# Patient Record
Sex: Female | Born: 1965 | Race: White | Hispanic: No | Marital: Married | Smoking: Former smoker
Health system: Southern US, Community
[De-identification: ages and names within clinical notes are randomized; demographics above are authoritative.]

## PROBLEM LIST (undated history)

## (undated) DIAGNOSIS — I10 Essential (primary) hypertension: Secondary | ICD-10-CM

## (undated) HISTORY — PX: ANKLE SURGERY: SHX546

## (undated) HISTORY — PX: HAND RECONSTRUCTION: SHX1730

## (undated) HISTORY — PX: BACK SURGERY: SHX140

---

## 1998-09-19 ENCOUNTER — Ambulatory Visit (HOSPITAL_BASED_OUTPATIENT_CLINIC_OR_DEPARTMENT_OTHER): Admission: RE | Admit: 1998-09-19 | Discharge: 1998-09-19 | Payer: Self-pay | Admitting: Orthopaedic Surgery

## 1999-04-23 ENCOUNTER — Other Ambulatory Visit: Admission: RE | Admit: 1999-04-23 | Discharge: 1999-04-23 | Payer: Self-pay | Admitting: Obstetrics and Gynecology

## 2000-02-03 ENCOUNTER — Ambulatory Visit (HOSPITAL_COMMUNITY): Admission: RE | Admit: 2000-02-03 | Discharge: 2000-02-03 | Payer: Self-pay | Admitting: Obstetrics and Gynecology

## 2000-05-04 ENCOUNTER — Inpatient Hospital Stay (HOSPITAL_COMMUNITY): Admission: AD | Admit: 2000-05-04 | Discharge: 2000-05-06 | Payer: Self-pay | Admitting: Obstetrics and Gynecology

## 2000-05-07 ENCOUNTER — Encounter: Admission: RE | Admit: 2000-05-07 | Discharge: 2000-05-18 | Payer: Self-pay | Admitting: Obstetrics and Gynecology

## 2000-06-08 ENCOUNTER — Other Ambulatory Visit: Admission: RE | Admit: 2000-06-08 | Discharge: 2000-06-08 | Payer: Self-pay | Admitting: Obstetrics and Gynecology

## 2001-07-12 ENCOUNTER — Other Ambulatory Visit: Admission: RE | Admit: 2001-07-12 | Discharge: 2001-07-12 | Payer: Self-pay | Admitting: Obstetrics and Gynecology

## 2002-08-17 ENCOUNTER — Other Ambulatory Visit: Admission: RE | Admit: 2002-08-17 | Discharge: 2002-08-17 | Payer: Self-pay | Admitting: Obstetrics and Gynecology

## 2003-11-14 ENCOUNTER — Other Ambulatory Visit: Admission: RE | Admit: 2003-11-14 | Discharge: 2003-11-14 | Payer: Self-pay | Admitting: Obstetrics and Gynecology

## 2004-11-14 ENCOUNTER — Other Ambulatory Visit: Admission: RE | Admit: 2004-11-14 | Discharge: 2004-11-14 | Payer: Self-pay | Admitting: Obstetrics and Gynecology

## 2005-12-07 ENCOUNTER — Ambulatory Visit (HOSPITAL_COMMUNITY): Admission: RE | Admit: 2005-12-07 | Discharge: 2005-12-08 | Payer: Self-pay | Admitting: Orthopaedic Surgery

## 2005-12-11 ENCOUNTER — Inpatient Hospital Stay (HOSPITAL_COMMUNITY): Admission: EM | Admit: 2005-12-11 | Discharge: 2005-12-13 | Payer: Self-pay | Admitting: Emergency Medicine

## 2005-12-11 ENCOUNTER — Emergency Department (HOSPITAL_COMMUNITY): Admission: EM | Admit: 2005-12-11 | Discharge: 2005-12-11 | Payer: Self-pay | Admitting: Emergency Medicine

## 2008-11-04 ENCOUNTER — Emergency Department (HOSPITAL_COMMUNITY): Admission: EM | Admit: 2008-11-04 | Discharge: 2008-11-04 | Payer: Self-pay | Admitting: Emergency Medicine

## 2009-02-05 ENCOUNTER — Encounter: Admission: RE | Admit: 2009-02-05 | Discharge: 2009-02-05 | Payer: Self-pay | Admitting: Family Medicine

## 2011-01-30 NOTE — Consult Note (Signed)
[name], [name] NO.:  [id number]   MEDICAL RECORD NO.:  [id number]          PATIENT TYPE:  INP   LOCATION:  3009                         FACILITY:  [facility]   PHYSICIAN:  Wilmon Arms. Corliss Skains, M.D. DATE OF BIRTH:  07/23/1966   DATE OF CONSULTATION:  12/12/2005  DATE OF DISCHARGE:  12/13/2005                                   CONSULTATION   REASON FOR CONSULTATION:  Nausea, vomiting, abdominal pain.   HISTORY OF PRESENT ILLNESS:  The patient is a 45 year old female in  excellent health who is four days postoperative from a lumbar  microdiskectomy for chronic back pain.  The patient was discharged home on  Tuesday and has been using Tylox and Robaxin regularly.  The patient did not  have a bowel movement for several days but had one bowel movement on  Thursday evening.  Friday morning, she awoke with severe generalized  abdominal pain.  She presented to the emergency department where she was  worked up and was found to be constipated.  She was given an enema with good  results.  The patient has since had several hard bowel movements.  She was  discharged later in the day on Friday.  She did have two episodes of  nonbloody emesis.  She now is having some diarrhea containing small blood  clots.  She also complains of lower abdominal pain.  She was seen in the  emergency department once again this evening and is being admitted by Dr.  Ophelia Charter for further evaluation.   We are asked to consult.   ALLERGIES:  None.   MEDICATIONS:  Tylox and Robaxin p.r.n.   PAST MEDICAL HISTORY:  Chronic back pain.   PAST SURGICAL HISTORY:  Lumbar discectomy and ankle surgery.   SOCIAL HISTORY:  Nonsmoker and nondrinker.   PHYSICAL EXAMINATION:  VITAL SIGNS:  Temperature 96.8, blood pressure  119/77, pulse 103, respirations 24.  GENERAL:  This is a well-developed, well-nourished female who appears  uncomfortable with a nasogastric tube in place.  HEENT:  EOMI.  Sclerae  anicteric.  LUNGS:  Clear to auscultation bilaterally.  Normal respiratory effort.  HEART:  Regular rate and rhythm.  No murmurs.  ABDOMEN:  Normal bowel sounds.  Soft, mildly tender in both lower quadrants  with no rebound and no guarding.  SKIN:  Warm and dry with no sign of jaundice.   LABORATORY DATA:  White count 8.8, hemoglobin 12.9.  Electrolytes within  normal limits.  Total bilirubin of 0.6.  Amylase and lipase are normal.  Acute abdominal series shows normal bowel gas pattern with no evidence of  free air.  The constipation has improved from this morning x-rays.   IMPRESSION:  Ileus postoperative with narcotic use, also with some  significant constipation.  Her ileus seems to be resolving.   RECOMMENDATIONS:  Continue nasogastric tube and bowel rest with IV  hydration.  It does not appear that the patient needs any further studies at  this time.  We will continue to follow.      Wilmon Arms. Tsuei, M.D.  Electronically Signed  MKT/MEDQ  D:  12/12/2005  T:  12/15/2005  Job:  [id number]

## 2011-01-30 NOTE — Op Note (Signed)
NAMEJOLYSSA, [name]               ACCOUNT NO.:  [id number]   MEDICAL RECORD NO.:  [id number]          PATIENT TYPE:  OIB   LOCATION:  3014                         FACILITY:  [facility]   PHYSICIAN:  Mark C. Ophelia Charter, M.D.    DATE OF BIRTH:  [birth year]   DATE OF PROCEDURE:  12/07/2005  DATE OF DISCHARGE:  12/08/2005                                 OPERATIVE REPORT   PREOPERATIVE DIAGNOSIS:  Left L5-S1 herniated nucleus pulposus.   POSTOPERATIVE DIAGNOSIS:  Left L5-S1 herniated nucleus pulposus.   PROCEDURE:  Left L5-S1, microdiskectomy.   SURGEON:  Mark C. Ophelia Charter, M.D.   ANESTHESIA:  General orotracheal anesthesia, plus Marcaine to the skin  local.   ESTIMATED BLOOD LOSS:  Minimal.   PROCEDURE:  After induction of general anesthesia, orotracheal intubation  was placed on chest rolls.  The back was prepped with DuraPrep.  The usual  ___________ and thigh drape application and laminectomy sheet.  A midline  incision was made at the L5-S1 interspace after needle localization with the  spinal needle cross table lateral x-ray.  Subperiosteal dissection on the  limb was performed.  There was a hypertrophic S1 spinous process and the  self retaining retractor would extend across the midline.  Ligamentum was  removed and there was said over growth with perhaps the disk space seen on  plain radiographs as well as MRI.  This had narrowed the foramina with facet  degenerative changes.  On the left side, the facet was trimmed back towards  the pedicle.  The nerve root was well visualized and gently retracted toward  the midline.  There was some mild jumping of nerve from nerve root  irritation and immediately underneath the nerve root was a large disk  herniation, which with a small stab incision two large pieces were removed.  Further passes were made and removed in some smaller pieces.  Passes were  made in the disk up and down the regular pituitary, Epstein curette.  A  small herniated sac was  completely cleaned out.  A hockey stick could be  passed anterior to the dura 180 degrees.  No areas of compression.  Nerve  root was gently palpated all sides of the foramina, no areas of compression.  The lateral gutter was smooth with no except bones by irrigation of the disk  and saline solution.  IV fascia was closed with 0 Vicryl, 2-0 Vicryl,  subcutaneous tissue, 4-0 Vicryl subcuticular skin closure, tincture of  Benzoin, Steri-Strips, 4 x 4s and tape.  Instrument count and needle counts  were correct.   ADDENDUM:  Preoperative Ancef was given prior to skin incision and started  on induction.      Mark C. Ophelia Charter, M.D.  Electronically Signed     MCY/MEDQ  D:  12/07/2005  T:  12/09/2005  Job:  [id number]

## 2012-11-24 ENCOUNTER — Emergency Department (HOSPITAL_COMMUNITY): Payer: BC Managed Care – PPO

## 2012-11-24 ENCOUNTER — Encounter (HOSPITAL_COMMUNITY): Payer: Self-pay | Admitting: *Deleted

## 2012-11-24 ENCOUNTER — Emergency Department (HOSPITAL_COMMUNITY)
Admission: EM | Admit: 2012-11-24 | Discharge: 2012-11-25 | Disposition: A | Discharge status: Home / Self Care | Payer: BC Managed Care – PPO | Attending: Emergency Medicine | Admitting: Emergency Medicine

## 2012-11-24 DIAGNOSIS — F17200 Nicotine dependence, unspecified, uncomplicated: Secondary | ICD-10-CM | POA: Insufficient documentation

## 2012-11-24 DIAGNOSIS — Y9389 Activity, other specified: Secondary | ICD-10-CM | POA: Insufficient documentation

## 2012-11-24 DIAGNOSIS — Z23 Encounter for immunization: Secondary | ICD-10-CM | POA: Insufficient documentation

## 2012-11-24 DIAGNOSIS — Y92009 Unspecified place in unspecified non-institutional (private) residence as the place of occurrence of the external cause: Secondary | ICD-10-CM | POA: Insufficient documentation

## 2012-11-24 DIAGNOSIS — W268XXA Contact with other sharp object(s), not elsewhere classified, initial encounter: Secondary | ICD-10-CM | POA: Insufficient documentation

## 2012-11-24 DIAGNOSIS — S61411A Laceration without foreign body of right hand, initial encounter: Secondary | ICD-10-CM

## 2012-11-24 DIAGNOSIS — S61409A Unspecified open wound of unspecified hand, initial encounter: Secondary | ICD-10-CM | POA: Insufficient documentation

## 2012-11-24 DIAGNOSIS — R209 Unspecified disturbances of skin sensation: Secondary | ICD-10-CM | POA: Insufficient documentation

## 2012-11-24 MED ORDER — MORPHINE SULFATE 4 MG/ML IJ SOLN
4.0000 mg | Freq: Once | INTRAMUSCULAR | Status: AC
  Administered 2012-11-24: 4 mg via INTRAMUSCULAR
  Filled 2012-11-24: qty 1

## 2012-11-24 MED ORDER — TETANUS-DIPHTH-ACELL PERTUSSIS 5-2.5-18.5 LF-MCG/0.5 IM SUSP
0.5000 mL | Freq: Once | INTRAMUSCULAR | Status: AC
  Administered 2012-11-24: 0.5 mL via INTRAMUSCULAR
  Filled 2012-11-24: qty 0.5

## 2012-11-24 NOTE — ED Provider Notes (Addendum)
History     CSN: [id number]  Arrival date & time 11/24/12  2114   First MD Initiated Contact with Patient 11/24/12 2209      Chief Complaint  Patient presents with  . Hand Injury    (Consider location/radiation/quality/duration/timing/severity/associated sxs/prior treatment) HPI  This is a pleasant 46 rolled female who fell in her driveway with a wine bottle in her hand tonight and suffered a laceration to the right palm. She states she pulled a piece of glass out of it. It is not particularly painful this point in time. She does have some numbness down to her fingers and has not been able to bend her finger. He states it does hurt up into her forearm when she attempts to bend the fingers in her right hand. She is left-handed  this happened just prior to presentation. She has had some spurting of blood from this area. It is controlled with pressure dressing. She is unsure when her last tetanus was.  History reviewed. No pertinent past medical history.  Past Surgical History  Procedure Laterality Date  . Back surgery      No family history on file.  History  Substance Use Topics  . Smoking status: Current Some Day Smoker    Types: Cigarettes  . Smokeless tobacco: Not on file  . Alcohol Use: 1.2 oz/week    2 Glasses of wine per week    OB History   Grav Para Term Preterm Abortions TAB SAB Ect Mult Living                  Review of Systems  Skin: Positive for wound.    Allergies  Review of patient's allergies indicates no known allergies.  Home Medications   Current Outpatient Rx  Name  Route  Sig  Dispense  Refill  . Milk Thistle 1000 MG CAPS   Oral   Take 1 capsule by mouth daily.           BP 122/81  Pulse 76  Temp(Src) 98.3 F (36.8 C) (Oral)  Resp 18  SpO2 100%  LMP 11/24/2012  Physical Exam  Nursing note and vitals reviewed. Constitutional: She is oriented to person, place, and time. She appears well-developed and well-nourished.  HENT:   Head: Normocephalic and atraumatic.  Eyes: Conjunctivae are normal. Pupils are equal, round, and reactive to light.  Neck: Normal range of motion.  Musculoskeletal:  3 cm laceration base of palm ulnar aspect Fourth and fifth finger are able to flex. 2 point sensation is intact to dip of fifth finger but is decreased distally.    Neurological: She is alert and oriented to person, place, and time.  Skin: Skin is warm and dry.  Psychiatric: She has a normal mood and affect.    ED Course  Procedures (including critical care time)  Labs Reviewed - No data to display Dg Hand Complete Right  11/24/2012  *RADIOLOGY REPORT*  Clinical Data: Pain, laceration to the wrist  RIGHT HAND - COMPLETE 3+ VIEW  Comparison: None.  Findings: The third digit is in the flexed position which limits evaluation.  No fracture or dislocation is identified.  No radiopaque foreign body.  Bandage overlies the hand.  IMPRESSION: No acute osseous abnormality.  See above.  No radiopaque foreign body.   Original Report Authenticated By: Jearld Lesch, M.D.      No diagnosis found.    MDM  Please see procedure note by Perrin Maltese. I was present and supervised  laceration repair. No foreign body was seen on x-ray or exploration. Skin is being loosely closed. Plan is to splint the wrist. Dr. Izora Ribas is being consulted for close followup. Patient received her tetanus shot here in emergency department the   Discussed with Dr. Izora Ribas and patient is to call at 9 AM tomorrow morning to be seen tomorrow. She is placed on Keflex the    Hilario Quarry, MD 11/24/12 1191  Hilario Quarry, MD 11/25/12 [phone]

## 2012-11-24 NOTE — ED Notes (Signed)
Pt tripped and fell; carrying a wine bottle; fell breaking wine bottle--landed on broken bottle with right hand; presents with large laceration palm of hand-moderate amt of bleeding; c/o numbness to right small finger and partial numbness right ring finger; unable to bend small finger and ring finger

## 2012-11-25 MED ORDER — OXYCODONE-ACETAMINOPHEN 5-325 MG PO TABS: 2.0000 | ORAL_TABLET | ORAL | Status: DC | PRN

## 2012-11-25 MED ORDER — CEPHALEXIN 500 MG PO CAPS: 500.0000 mg | ORAL_CAPSULE | Freq: Four times a day (QID) | ORAL | Status: DC

## 2012-11-25 MED ORDER — OXYCODONE-ACETAMINOPHEN 5-325 MG PO TABS
2.0000 | ORAL_TABLET | Freq: Once | ORAL | Status: AC
  Administered 2012-11-25: 2 via ORAL
  Filled 2012-11-25: qty 2

## 2012-11-25 NOTE — ED Provider Notes (Signed)
[name],[name] Patient discussed with attending physician. Will assist with wound repair of right hand.  LACERATION REPAIR Performed by: Angus Seller Authorized by: Angus Seller Consent: Verbal consent obtained. Risks and benefits: risks, benefits and alternatives were discussed Consent given by: patient Patient identity confirmed: provided demographic data Prepped and Draped in normal sterile fashion Wound explored  Laceration Location: Minor eminence of right palm  Laceration Length: 4 cm  No Foreign Bodies seen or palpated  Anesthesia: local infiltration  Local anesthetic: lidocaine 2% with epinephrine  Anesthetic total: 8 ml  Irrigation method: syringe Amount of cleaning: standard  Skin closure: Skin with 4-0 Prolene   Number of sutures: 5   Technique: Simple   Patient tolerance: Patient tolerated the procedure well with no immediate complications.     Angus Seller, PA-C 11/25/12 [phone]

## 2012-12-03 NOTE — ED Provider Notes (Signed)
History/physical exam/procedure(s) were performed by non-physician practitioner and as supervising physician I was immediately available for consultation/collaboration. I have reviewed all notes and am in agreement with care and plan.   Danielle S Ray, MD 12/03/12 0754 

## 2013-10-17 ENCOUNTER — Other Ambulatory Visit (HOSPITAL_COMMUNITY): Payer: Self-pay | Admitting: Interventional Radiology

## 2013-10-17 DIAGNOSIS — I8393 Asymptomatic varicose veins of bilateral lower extremities: Secondary | ICD-10-CM

## 2013-11-01 ENCOUNTER — Inpatient Hospital Stay: Admission: RE | Admit: 2013-11-01 | Payer: BC Managed Care – PPO | Source: Ambulatory Visit

## 2013-11-01 ENCOUNTER — Other Ambulatory Visit: Payer: BC Managed Care – PPO

## 2013-11-14 ENCOUNTER — Other Ambulatory Visit (HOSPITAL_COMMUNITY): Payer: Self-pay | Admitting: Interventional Radiology

## 2013-11-14 ENCOUNTER — Ambulatory Visit
Admission: RE | Admit: 2013-11-14 | Discharge: 2013-11-14 | Disposition: A | Discharge status: Home / Self Care | Payer: BC Managed Care – PPO | Source: Ambulatory Visit | Attending: Interventional Radiology | Admitting: Interventional Radiology

## 2013-11-14 DIAGNOSIS — I8393 Asymptomatic varicose veins of bilateral lower extremities: Secondary | ICD-10-CM

## 2013-11-15 ENCOUNTER — Other Ambulatory Visit (HOSPITAL_COMMUNITY): Payer: Self-pay | Admitting: Interventional Radiology

## 2013-11-15 DIAGNOSIS — I8390 Asymptomatic varicose veins of unspecified lower extremity: Secondary | ICD-10-CM

## 2013-12-13 ENCOUNTER — Ambulatory Visit: Payer: BC Managed Care – PPO

## 2014-07-04 ENCOUNTER — Ambulatory Visit (INDEPENDENT_AMBULATORY_CARE_PROVIDER_SITE_OTHER): Payer: BC Managed Care – PPO | Admitting: Emergency Medicine

## 2014-07-04 ENCOUNTER — Ambulatory Visit (INDEPENDENT_AMBULATORY_CARE_PROVIDER_SITE_OTHER): Payer: BC Managed Care – PPO

## 2014-07-04 VITALS — BP 124/88 | HR 85 | Temp 97.5°F | Resp 24 | Ht 66.0 in | Wt 127.4 lb

## 2014-07-04 DIAGNOSIS — R0602 Shortness of breath: Secondary | ICD-10-CM

## 2014-07-04 DIAGNOSIS — J018 Other acute sinusitis: Secondary | ICD-10-CM

## 2014-07-04 DIAGNOSIS — J219 Acute bronchiolitis, unspecified: Secondary | ICD-10-CM

## 2014-07-04 MED ORDER — PSEUDOEPHEDRINE-GUAIFENESIN ER 60-600 MG PO TB12: 1.0000 | ORAL_TABLET | Freq: Two times a day (BID) | ORAL | Status: AC

## 2014-07-04 MED ORDER — ALBUTEROL SULFATE (2.5 MG/3ML) 0.083% IN NEBU
5.0000 mg | INHALATION_SOLUTION | Freq: Once | RESPIRATORY_TRACT | Status: AC
  Administered 2014-07-04: 5 mg via RESPIRATORY_TRACT

## 2014-07-04 MED ORDER — IPRATROPIUM BROMIDE 0.02 % IN SOLN
0.2500 mg | Freq: Once | RESPIRATORY_TRACT | Status: AC
  Administered 2014-07-04: 0.25 mg via RESPIRATORY_TRACT

## 2014-07-04 MED ORDER — AMOXICILLIN-POT CLAVULANATE 875-125 MG PO TABS: 1.0000 | ORAL_TABLET | Freq: Two times a day (BID) | ORAL | Status: DC

## 2014-07-04 MED ORDER — ALBUTEROL SULFATE HFA 108 (90 BASE) MCG/ACT IN AERS: 2.0000 | INHALATION_SPRAY | RESPIRATORY_TRACT | Status: DC | PRN

## 2014-07-04 NOTE — Patient Instructions (Signed)
Metered Dose Inhaler (No Spacer Used)  Inhaled medicines are the basis of treatment for asthma and other breathing problems. Inhaled medicine can only be effective if used properly. Good technique assures that the medicine reaches the lungs.  Metered dose inhalers (MDIs) are used to deliver a variety of inhaled medicines. These include quick relief or rescue medicines (such as bronchodilators) and controller medicines (such as corticosteroids). The medicine is delivered by pushing down on a metal canister to release a set amount of spray.  If you are using different kinds of inhalers, use your quick relief medicine to open the airways 10-15 minutes before using a steroid, if instructed to do so by your health care provider. If you are unsure which inhalers to use and the order of using them, ask your health care provider, nurse, or respiratory therapist.  HOW TO USE THE INHALER  1. Remove the cap from the inhaler.  2. If you are using the inhaler for the first time, you will need to prime it. Shake the inhaler for 5 seconds and release four puffs into the air, away from your face. Ask your health care provider or pharmacist if you have questions about priming your inhaler.  3. Shake the inhaler for 5 seconds before each breath in (inhalation).  4. Position the inhaler so that the top of the canister faces up.  5. Put your index finger on the top of the medicine canister. Your thumb supports the bottom of the inhaler.  6. Open your mouth.  7. Either place the inhaler between your teeth and place your lips tightly around the mouthpiece, or hold the inhaler 1-2 inches away from your open mouth. If you are unsure of which technique to use, ask your health care provider.  8. Breathe out (exhale) normally and as completely as possible.  9. Press the canister down with the index finger to release the medicine.  10. At the same time as the canister is pressed, inhale deeply and slowly until your lungs are completely filled.  This should take 4-6 seconds. Keep your tongue down.  11. Hold the medicine in your lungs for 5-10 seconds (10 seconds is best). This helps the medicine get into the small airways of your lungs.  12. Breathe out slowly, through pursed lips. Whistling is an example of pursed lips.  13. Wait at least 1 minute between puffs. Continue with the above steps until you have taken the number of puffs your health care provider has ordered. Do not use the inhaler more than your health care provider directs you to.  14. Replace the cap on the inhaler.  15. Follow the directions from your health care provider or the inhaler insert for cleaning the inhaler.  If you are using a steroid inhaler, after your last puff, rinse your mouth with water, gargle, and spit out the water. Do not swallow the water.  AVOID:  · Inhaling before or after starting the spray of medicine. It takes practice to coordinate your breathing with triggering the spray.  · Inhaling through the nose (rather than the mouth) when triggering the spray.  HOW TO DETERMINE IF YOUR INHALER IS FULL OR NEARLY EMPTY  You cannot know when an inhaler is empty by shaking it. Some inhalers are now being made with dose counters. Ask your health care provider for a prescription that has a dose counter if you feel you need that extra help. If your inhaler does not have a counter, ask your health care   provider to help you determine the date you need to refill your inhaler. Write the refill date on a calendar or your inhaler canister. Refill your inhaler 7-10 days before it runs out. Be sure to keep an adequate supply of medicine. This includes making sure it has not expired, and making sure you have a spare inhaler.  SEEK MEDICAL CARE IF:  · Symptoms are only partially relieved with your inhaler.  · You are having trouble using your inhaler.  · You experience an increase in phlegm.  SEEK IMMEDIATE MEDICAL CARE IF:  · You feel little or no relief with your inhalers. You are still  wheezing and feeling shortness of breath, tightness in your chest, or both.  · You have dizziness, headaches, or a fast heart rate.  · You have chills, fever, or night sweats.  · There is a noticeable increase in phlegm production, or there is blood in the phlegm.  MAKE SURE YOU:  · Understand these instructions.  · Will watch your condition.  · Will get help right away if you are not doing well or get worse.  Document Released: 06/28/2007 Document Revised: 01/15/2014 Document Reviewed: 02/16/2013  Exit[employer]® Patient Information ©2015 [employer]. This information is not intended to replace advice given to you by your health care provider. Make sure you discuss any questions you have with your health care provider.

## 2014-07-04 NOTE — Progress Notes (Addendum)
[facility] [address], [place] [address] [phone]  Date:  07/04/2014   Name:  [name]   DOB:  February 24, 1966   MRN:  [id number]  PCP:  No PCP Per Patient    Chief Complaint: Cough, Shortness of Breath, Chest Pain and Loss of appetite   History of Present Illness:  [name] is a 48 y.o. very pleasant female patient who presents with the following:  Il since last weekend but worsened.  Has nasal congestion and post nasal drainage.  Purulent in nature. Has a cough that is not productive and is associated with shortness of breath started yesterday. No wheezing.   Was able to walk 6 miles yesterday with no difficulty. Occasional smoker. No fever or chills.  No nausea or vomiting No stool change or rash. No improvement with over the counter medications or other home remedies.  Denies other complaint or health concern today.   There are no active problems to display for this patient.   History reviewed. No pertinent past medical history.  Past Surgical History  Procedure Laterality Date  . Back surgery      History  Substance Use Topics  . Smoking status: Former Smoker    Types: Cigarettes  . Smokeless tobacco: Never Used  . Alcohol Use: 4.2 oz/week    7 Glasses of wine per week    Family History  Problem Relation Age of Onset  . Heart disease Mother   . Cancer Father     No Known Allergies  Medication list has been reviewed and updated.  Current Outpatient Prescriptions on File Prior to Visit  Medication Sig Dispense Refill  . Milk Thistle 1000 MG CAPS Take 1 capsule by mouth daily.       No current facility-administered medications on file prior to visit.    Review of Systems:  As per HPI, otherwise negative.    Physical Examination: Filed Vitals:   07/04/14 1030  BP: 124/88  Pulse: 85  Temp: 97.5 F (36.4 C)  Resp: 24   Filed Vitals:   07/04/14 1030  Height: 5\' 6"  (1.676 m)  Weight: 127 lb 6.4 oz  (57.788 kg)   Body mass index is 20.57 kg/(m^2). Ideal Body Weight: Weight in (lb) to have BMI = 25: 154.6  GEN: WDWN, NAD, Non-toxic, A & O x 3 HEENT: Atraumatic, Normocephalic. Neck supple. No masses, No LAD. Ears and Nose: No external deformity. CV: RRR, No M/G/R. No JVD. No thrill. No extra heart sounds. PULM: diffuse wheezes, no crackles, rhonchi. No retractions. No resp. distress. No accessory muscle use. ABD: S, NT, ND, +BS. No rebound. No HSM. EXTR: No c/c/e NEURO Normal gait.  PSYCH: Normally interactive. Conversant. Not depressed or anxious appearing.  Calm demeanor.    Assessment and Plan: Sinusitis Bronchiolitis augmentin mucinex d Albuterol  Signed,  Phillips OdorJeffery Jace Fermin, MD   [facility] reading (PRIMARY) by  Dr. Dareen PianoAnderson.  Negative .

## 2014-08-21 ENCOUNTER — Telehealth: Payer: Self-pay | Admitting: Emergency Medicine

## 2014-08-21 NOTE — Telephone Encounter (Signed)
LMOAM AT HOME #  TO SEE IF PT READY TO SET UP APPT FOR JAN. W/ DR HOSS. ALSO MENTIONED THAT SHE WILL NOT BE ABLE TO PAY OUT OF POCKET IF SHE STILL HAS INSURANCE.

## 2014-09-05 ENCOUNTER — Telehealth: Payer: Self-pay | Admitting: Emergency Medicine

## 2014-09-05 ENCOUNTER — Other Ambulatory Visit (HOSPITAL_COMMUNITY): Payer: Self-pay | Admitting: Interventional Radiology

## 2014-09-05 DIAGNOSIS — I83811 Varicose veins of right lower extremities with pain: Secondary | ICD-10-CM

## 2014-09-05 NOTE — Telephone Encounter (Signed)
Left msg for pt to make her aware that [employer] denied her sclero and the appt on 10-23-14 will be to f/u and document conservative measures and we will refile after that visit.

## 2014-10-02 ENCOUNTER — Ambulatory Visit: Payer: BC Managed Care – PPO

## 2014-10-23 ENCOUNTER — Other Ambulatory Visit: Payer: BC Managed Care – PPO

## 2014-10-23 ENCOUNTER — Ambulatory Visit: Payer: BC Managed Care – PPO

## 2014-10-26 ENCOUNTER — Ambulatory Visit: Payer: BC Managed Care – PPO

## 2014-11-21 ENCOUNTER — Inpatient Hospital Stay: Admission: RE | Admit: 2014-11-21 | Payer: Self-pay | Source: Ambulatory Visit

## 2015-01-04 ENCOUNTER — Other Ambulatory Visit (HOSPITAL_COMMUNITY): Payer: Self-pay | Admitting: Interventional Radiology

## 2015-01-04 DIAGNOSIS — I83811 Varicose veins of right lower extremities with pain: Secondary | ICD-10-CM

## 2015-01-08 ENCOUNTER — Ambulatory Visit
Admission: RE | Admit: 2015-01-08 | Discharge: 2015-01-08 | Disposition: A | Discharge status: Home / Self Care | Payer: BLUE CROSS/BLUE SHIELD | Source: Ambulatory Visit | Attending: Interventional Radiology | Admitting: Interventional Radiology

## 2015-01-08 ENCOUNTER — Other Ambulatory Visit (HOSPITAL_COMMUNITY): Payer: Self-pay | Admitting: Interventional Radiology

## 2015-01-08 DIAGNOSIS — I83811 Varicose veins of right lower extremities with pain: Secondary | ICD-10-CM

## 2015-01-08 DIAGNOSIS — I83819 Varicose veins of unspecified lower extremities with pain: Secondary | ICD-10-CM | POA: Insufficient documentation

## 2015-01-08 NOTE — Consult Note (Signed)
Chief Complaint: Chief Complaint  Patient presents with  . Varicose Veins    Sclerotherapy of RLE for Varicose Veins      Referring Physician(s): Berley Gambrell  History of Present Illness: [name] is a 49 y.o. female returns for evaluation of the varicosities in her right calf. The pain has improved, but she continues to complain about the appearance of the varicose veins. She is here for treatment and disappearance of the varicose veins.  No past medical history on file.  Past Surgical History  Procedure Laterality Date  . Back surgery      Allergies: Review of patient's allergies indicates no known allergies.  Medications: Prior to Admission medications   Medication Sig Start Date End Date Taking? Authorizing Provider  albuterol (PROVENTIL HFA;VENTOLIN HFA) 108 (90 BASE) MCG/ACT inhaler Inhale 2 puffs into the lungs every 4 (four) hours as needed for wheezing or shortness of breath (cough, shortness of breath or wheezing.). 07/04/14   Carmelina Dane, MD  amoxicillin-clavulanate (AUGMENTIN) 875-125 MG per tablet Take 1 tablet by mouth 2 (two) times daily. 07/04/14   Carmelina Dane, MD  Milk Thistle 1000 MG CAPS Take 1 capsule by mouth daily.    Historical Provider, MD  pseudoephedrine-guaifenesin (MUCINEX D) 60-600 MG per tablet Take 1 tablet by mouth every 12 (twelve) hours. 07/04/14 07/04/15  Carmelina Dane, MD     Family History  Problem Relation Age of Onset  . Heart disease Mother   . Cancer Father     History   Social History  . Marital Status: Married    Spouse Name: N/A  . Number of Children: N/A  . Years of Education: N/A   Social History Main Topics  . Smoking status: Former Smoker    Types: Cigarettes  . Smokeless tobacco: Never Used  . Alcohol Use: 4.2 oz/week    7 Glasses of wine per week  . Drug Use: No  . Sexual Activity: Not on file   Other Topics Concern  . Not on file   Social History Narrative  . No narrative on  file     Review of Systems: A 12 point ROS discussed and pertinent positives are indicated in the HPI above.  All other systems are negative.  Review of Systems  Vital Signs: BP 120/93 mmHg  Pulse 81  Temp(Src) 97.7 F (36.5 C) (Oral)  Resp 14  SpO2 99%  Physical Exam  Mallampati Score:     Imaging: [place] Injec Sclerotherapy Single  01/08/2015   CLINICAL DATA:  Persistent varicosities along the right lateral calf  EXAM: ULTRASOUND INJECTION SCLEROTHERAPHY SINGLE  TECHNIQUE: The lateral right calf was prepped with alcohol swabs. Two injection sites were located. Sotradecol foam sclerotherapy was then injected utilizing a 25 gauge needle into the varicosities and perforating branches. Final imaging was performed.  COMPARISON:  None.  FINDINGS: Imaging confirms needle placement in the varicosities and foam sclerotherapy throughout the varicose veins. This includes material within the varicosities anterior and just below the knee.  IMPRESSION: Successful foam sclerotherapy at 2 locations in the lateral right calf.   Electronically Signed   By: Jolaine Click M.D.   On: 01/08/2015 11:10    Labs:  CBC: No results for input(s): WBC, HGB, HCT, PLT in the last 8760 hours.  COAGS: No results for input(s): INR, APTT in the last 8760 hours.  BMP: No results for input(s): NA, K, CL, CO2, GLUCOSE, BUN, CALCIUM, CREATININE, GFRNONAA, GFRAA in the last  8760 hours.  Invalid input(s): CMP  LIVER FUNCTION TESTS: No results for input(s): BILITOT, AST, ALT, ALKPHOS, PROT, ALBUMIN in the last 8760 hours.  TUMOR MARKERS: No results for input(s): AFPTM, CEA, CA199, CHROMGRNA in the last 8760 hours.  Assessment and Plan:  We will perform sclerotherapy along the right lateral calf to eliminate her varicose veins.  Thank you for this interesting consult.  I greatly enjoyed meeting [name] and look forward to participating in their care.  Signed: Nayson Traweek, ART A 01/08/2015, 11:13 AM   I  spent a total of  5 Minutes in face to face in clinical consultation, greater than 50% of which was counseling/coordinating care for varicose veins

## 2015-02-13 ENCOUNTER — Ambulatory Visit
Admission: RE | Admit: 2015-02-13 | Discharge: 2015-02-13 | Disposition: A | Discharge status: Home / Self Care | Payer: BLUE CROSS/BLUE SHIELD | Source: Ambulatory Visit | Attending: Interventional Radiology | Admitting: Interventional Radiology

## 2015-02-13 DIAGNOSIS — I83811 Varicose veins of right lower extremities with pain: Secondary | ICD-10-CM

## 2015-02-13 NOTE — Progress Notes (Signed)
Patient ID: [name], female   DOB: 08/05/1966, 49 y.o.   MRN: [id number]    Chief Complaint: No chief complaint on file.   Referring Physician(s): Nupur Hohman  History of Present Illness: [name] is a 49 y.o. female s/p sclerotherapy for right calf varicosities. She had tenderness at the site of her varicosities, but this has resolved. She denies fever or chills. She denies any signs of infection at the sclerotherapy sites.  No past medical history on file.  Past Surgical History  Procedure Laterality Date  . Back surgery      Allergies: Review of patient's allergies indicates no known allergies.  Medications: Prior to Admission medications   Medication Sig Start Date End Date Taking? Authorizing Provider  albuterol (PROVENTIL HFA;VENTOLIN HFA) 108 (90 BASE) MCG/ACT inhaler Inhale 2 puffs into the lungs every 4 (four) hours as needed for wheezing or shortness of breath (cough, shortness of breath or wheezing.). 07/04/14   Carmelina Dane, MD  amoxicillin-clavulanate (AUGMENTIN) 875-125 MG per tablet Take 1 tablet by mouth 2 (two) times daily. 07/04/14   Carmelina Dane, MD  Milk Thistle 1000 MG CAPS Take 1 capsule by mouth daily.    Historical Provider, MD  pseudoephedrine-guaifenesin (MUCINEX D) 60-600 MG per tablet Take 1 tablet by mouth every 12 (twelve) hours. 07/04/14 07/04/15  Carmelina Dane, MD     Family History  Problem Relation Age of Onset  . Heart disease Mother   . Cancer Father     History   Social History  . Marital Status: Married    Spouse Name: N/A  . Number of Children: N/A  . Years of Education: N/A   Social History Main Topics  . Smoking status: Former Smoker    Types: Cigarettes  . Smokeless tobacco: Never Used  . Alcohol Use: 4.2 oz/week    7 Glasses of wine per week  . Drug Use: No  . Sexual Activity: Not on file   Other Topics Concern  . Not on file   Social History Narrative  . No narrative on file      Review of Systems: A 12 point ROS discussed and pertinent positives are indicated in the HPI above.  All other systems are negative.  Review of Systems  Vital Signs: There were no vitals taken for this visit.  Physical Exam  Constitutional: She appears well-developed and well-nourished.  Musculoskeletal:  R calf sclerotherapy sites are clean and without signs of infection.    Mallampati Score:     Imaging: No results found.  US imaging of the right calf varicosities demonstrates thrombosis of all superficial varicosities. This includes areas along the proximal half of the right calf laterally and across the shin.  Labs:  CBC: No results for input(s): WBC, HGB, HCT, PLT in the last 8760 hours.  COAGS: No results for input(s): INR, APTT in the last 8760 hours.  BMP: No results for input(s): NA, K, CL, CO2, GLUCOSE, BUN, CALCIUM, CREATININE, GFRNONAA, GFRAA in the last 8760 hours.  Invalid input(s): CMP  LIVER FUNCTION TESTS: No results for input(s): BILITOT, AST, ALT, ALKPHOS, PROT, ALBUMIN in the last 8760 hours.  TUMOR MARKERS: No results for input(s): AFPTM, CEA, CA199, CHROMGRNA in the last 8760 hours.  Assessment and Plan:  Sclerotherapy of the right calf and anterior leg varicosities has been successful as all of these varicosities are now thrombosed. These should slowly resolve and become absorbed over time. The patient was instructed to wear compression stockings as  much as possible and contact us regarding reappearance of varicosities.    Signed: Chirstopher Iovino, ART A 02/13/2015, 2:26 PM   I spent a total of   15 Minutes in face to face in clinical consultation, greater than 50% of which was counseling/coordinating care for varicose veins and sclerotherapy.

## 2016-04-06 ENCOUNTER — Emergency Department (HOSPITAL_COMMUNITY)
Admission: EM | Admit: 2016-04-06 | Discharge: 2016-04-06 | Disposition: A | Discharge status: Home / Self Care | Payer: Commercial Managed Care - HMO | Attending: Dermatology | Admitting: Dermatology

## 2016-04-06 ENCOUNTER — Encounter (HOSPITAL_COMMUNITY): Payer: Self-pay | Admitting: Emergency Medicine

## 2016-04-06 DIAGNOSIS — Z5321 Procedure and treatment not carried out due to patient leaving prior to being seen by health care provider: Secondary | ICD-10-CM | POA: Insufficient documentation

## 2016-04-06 DIAGNOSIS — Z87891 Personal history of nicotine dependence: Secondary | ICD-10-CM | POA: Insufficient documentation

## 2016-04-06 DIAGNOSIS — R110 Nausea: Secondary | ICD-10-CM | POA: Insufficient documentation

## 2016-04-06 DIAGNOSIS — R5383 Other fatigue: Secondary | ICD-10-CM | POA: Insufficient documentation

## 2016-04-06 DIAGNOSIS — I10 Essential (primary) hypertension: Secondary | ICD-10-CM | POA: Insufficient documentation

## 2016-04-06 DIAGNOSIS — R42 Dizziness and giddiness: Secondary | ICD-10-CM | POA: Diagnosis present

## 2016-04-06 HISTORY — DX: Essential (primary) hypertension: I10

## 2016-04-06 NOTE — ED Triage Notes (Signed)
Patient states that she got back from [place] on Friday then was walking Saturday around 230p when she  Had a heat stroke".  Patient was at PCP for annual f/u and was telling MD about symptoms on Saturday.  Patient states that symptoms came back around 1230p today--fatigue, dizzy, headache and having trouble/hurts  read on her phone due to making head hurt worse.  Patient has no appetite which states is very rare for her. Patient states that she normally drinks gallon of water daily and never had problems walking in heat before.

## 2016-04-06 NOTE — ED Triage Notes (Signed)
Pt stated she did not want to wait and left 

## 2016-07-22 ENCOUNTER — Encounter: Payer: Self-pay | Admitting: Internal Medicine

## 2016-09-15 ENCOUNTER — Ambulatory Visit (AMBULATORY_SURGERY_CENTER): Payer: Self-pay | Admitting: *Deleted

## 2016-09-15 VITALS — Ht 66.5 in | Wt 128.0 lb

## 2016-09-15 DIAGNOSIS — Z1211 Encounter for screening for malignant neoplasm of colon: Secondary | ICD-10-CM

## 2016-09-15 MED ORDER — NA SULFATE-K SULFATE-MG SULF 17.5-3.13-1.6 GM/177ML PO SOLN: ORAL | 0 refills | Status: DC

## 2016-09-15 NOTE — Progress Notes (Signed)
No egg or soy allergy  No anesthesia or intubation problems per pt  No diet medications taken  No hx of sleep apnea or home oxygen used  $15 off Suprep coupon given

## 2016-09-23 ENCOUNTER — Ambulatory Visit (AMBULATORY_SURGERY_CENTER): Payer: Commercial Managed Care - HMO | Admitting: Internal Medicine

## 2016-09-23 ENCOUNTER — Encounter: Payer: Self-pay | Admitting: Internal Medicine

## 2016-09-23 VITALS — BP 103/61 | HR 68 | Temp 97.8°F | Resp 14 | Ht 66.5 in | Wt 128.0 lb

## 2016-09-23 DIAGNOSIS — Z1212 Encounter for screening for malignant neoplasm of rectum: Secondary | ICD-10-CM

## 2016-09-23 DIAGNOSIS — Z1211 Encounter for screening for malignant neoplasm of colon: Secondary | ICD-10-CM

## 2016-09-23 MED ORDER — SODIUM CHLORIDE 0.9 % IV SOLN: 500.0000 mL | INTRAVENOUS | Status: DC

## 2016-09-23 NOTE — Patient Instructions (Signed)
Impression/Recommendations:  Diverticulosis handout given to patient. Hemorrhoid handout given to patient.  Repeat colonoscopy in 10 years for screening purposes.  Resume previous diet. Continue present medications.  YOU HAD AN ENDOSCOPIC PROCEDURE TODAY AT THE [facility]:   Refer to the procedure report that was given to you for any specific questions about what was found during the examination.  If the procedure report does not answer your questions, please call your gastroenterologist to clarify.  If you requested that your care partner not be given the details of your procedure findings, then the procedure report has been included in a sealed envelope for you to review at your convenience later.  YOU SHOULD EXPECT: Some feelings of bloating in the abdomen. Passage of more gas than usual.  Walking can help get rid of the air that was put into your GI tract during the procedure and reduce the bloating. If you had a lower endoscopy (such as a colonoscopy or flexible sigmoidoscopy) you may notice spotting of blood in your stool or on the toilet paper. If you underwent a bowel prep for your procedure, you may not have a normal bowel movement for a few days.  Please Note:  You might notice some irritation and congestion in your nose or some drainage.  This is from the oxygen used during your procedure.  There is no need for concern and it should clear up in a day or so.  SYMPTOMS TO REPORT IMMEDIATELY:   Following lower endoscopy (colonoscopy or flexible sigmoidoscopy):  Excessive amounts of blood in the stool  Significant tenderness or worsening of abdominal pains  Swelling of the abdomen that is new, acute  Fever of 100F or higher For urgent or emergent issues, a gastroenterologist can be reached at any hour by calling [phone].   DIET:  We do recommend a small meal at first, but then you may proceed to your regular diet.  Drink plenty of fluids but you should avoid  alcoholic beverages for 24 hours.  ACTIVITY:  You should plan to take it easy for the rest of today and you should NOT DRIVE or use heavy machinery until tomorrow (because of the sedation medicines used during the test).    FOLLOW UP: Our staff will call the number listed on your records the next business day following your procedure to check on you and address any questions or concerns that you may have regarding the information given to you following your procedure. If we do not reach you, we will leave a message.  However, if you are feeling well and you are not experiencing any problems, there is no need to return our call.  We will assume that you have returned to your regular daily activities without incident.  If any biopsies were taken you will be contacted by phone or by letter within the next 1-3 weeks.  Please call us at [phone] if you have not heard about the biopsies in 3 weeks.    SIGNATURES/CONFIDENTIALITY: You and/or your care partner have signed paperwork which will be entered into your electronic medical record.  These signatures attest to the fact that that the information above on your After Visit Summary has been reviewed and is understood.  Full responsibility of the confidentiality of this discharge information lies with you and/or your care-partner.

## 2016-09-23 NOTE — Progress Notes (Signed)
Spontaneous respirations throughout. VSS. Resting comfortably. To PACU on room air. Report to  Jane RN. 

## 2016-09-23 NOTE — Op Note (Signed)
[facility] Patient Name: [name] Procedure Date: 09/23/2016 11:58 AM MRN: [id number] Endoscopist: [name] , MD Age: [age] Referring MD:  Date of Birth: [birth year] Gender: Female Account #: [id number] Procedure:                Colonoscopy Indications:              Screening for colorectal malignant neoplasm Medicines:                Monitored Anesthesia Care Procedure:                Pre-Anesthesia Assessment:                           - Prior to the procedure, a History and Physical                            was performed, and patient medications and                            allergies were reviewed. The patient's tolerance of                            previous anesthesia was also reviewed. The risks                            and benefits of the procedure and the sedation                            options and risks were discussed with the patient.                            All questions were answered, and informed consent                            was obtained. Prior Anticoagulants: The patient has                            taken no previous anticoagulant or antiplatelet                            agents. ASA Grade Assessment: II - A patient with                            mild systemic disease. After reviewing the risks                            and benefits, the patient was deemed in                            satisfactory condition to undergo the procedure.                           After obtaining informed consent, the colonoscope  was passed under direct vision. Throughout the                            procedure, the patient's blood pressure, pulse, and                            oxygen saturations were monitored continuously. The                            Model CF-HQ190L [id number]) scope was introduced                            through the anus and advanced to the the cecum,                            identified by  appendiceal orifice and ileocecal                            valve. The ileocecal valve, appendiceal orifice,                            and rectum were photographed. The quality of the                            bowel preparation was excellent. The colonoscopy                            was performed without difficulty. The patient                            tolerated the procedure well. The bowel preparation                            used was SUPREP. Scope In: 12:08:07 PM Scope Out: 12:26:16 PM Scope Withdrawal Time: 0 hours 11 minutes 51 seconds  Total Procedure Duration: 0 hours 18 minutes 9 seconds  Findings:                 A few medium-mouthed diverticula were found in the                            sigmoid colon.                           Internal hemorrhoids were found during                            retroflexion. The hemorrhoids were small.                           The exam was otherwise without abnormality on                            direct and retroflexion views. Complications:            No immediate complications. Estimated  blood loss:                            None. Estimated Blood Loss:     Estimated blood loss: none. Impression:               - Diverticulosis in the sigmoid colon.                           - Internal hemorrhoids.                           - The examination was otherwise normal on direct                            and retroflexion views.                           - No specimens collected. Recommendation:           - Repeat colonoscopy in 10 years for screening                            purposes.                           - Patient has a contact number available for                            emergencies. The signs and symptoms of potential                            delayed complications were discussed with the                            patient. Return to normal activities tomorrow.                            Written discharge instructions were  provided to the                            patient.                           - Resume previous diet.                           - Continue present medications. Wilhemina Bonito. Marina Goodell, MD 09/23/2016 12:33:06 PM This report has been signed electronically.

## 2016-09-24 ENCOUNTER — Telehealth: Payer: Self-pay

## 2016-09-24 NOTE — Telephone Encounter (Signed)
  Follow up Call-  Call back number 09/23/2016  Post procedure Call Back phone  # [phone]  Permission to leave phone message Yes  Some recent data might be hidden    Patient was called for follow up after her procedure on 09/23/2016. No answer at the number given for follow up phone call. A message was left on the answering machine.

## 2016-09-24 NOTE — Telephone Encounter (Signed)
  Follow up Call-  Call back number 09/23/2016  Post procedure Call Back phone  # [phone]  Permission to leave phone message Yes  Some recent data might be hidden    Patient was called for follow up after her procedure on 09/23/2016. No answer at the number given for follow up phone call. A message was left on the answering machine.  

## 2017-09-17 ENCOUNTER — Ambulatory Visit (INDEPENDENT_AMBULATORY_CARE_PROVIDER_SITE_OTHER): Payer: 59 | Admitting: Orthopaedic Surgery

## 2017-09-17 ENCOUNTER — Ambulatory Visit (INDEPENDENT_AMBULATORY_CARE_PROVIDER_SITE_OTHER): Payer: 59

## 2017-09-17 ENCOUNTER — Encounter (INDEPENDENT_AMBULATORY_CARE_PROVIDER_SITE_OTHER): Payer: Self-pay | Admitting: Orthopaedic Surgery

## 2017-09-17 VITALS — BP 122/78 | HR 78 | Ht 66.5 in | Wt 128.0 lb

## 2017-09-17 DIAGNOSIS — M25521 Pain in right elbow: Secondary | ICD-10-CM | POA: Diagnosis not present

## 2017-09-17 DIAGNOSIS — S5001XA Contusion of right elbow, initial encounter: Secondary | ICD-10-CM

## 2017-09-17 NOTE — Progress Notes (Signed)
Office Visit Note   Patient: [name]           Date of Birth: May 23, 1966           MRN: [id number] Visit Date: 09/17/2017              Requested by: Richardean Chimera, MD [address] [place], [place] [address] PCP: Richardean Chimera, MD   Assessment & Plan: Visit Diagnoses:  1. Pain in right elbow   2. Contusion of right elbow, initial encounter     Plan: she can use a sling PRN. NSAIDs and gentle ROM.  We reviewed the x-rays which are negative for acute fracture.  She has a significant elbow contusion from falling over the woodpile.  The sling intermittently and will return if she has persistent problems.  Follow-Up Instructions: No Follow-up on file.   Orders:  Orders Placed This Encounter  Procedures  . XR Elbow Complete Right (3+View)   No orders of the defined types were placed in this encounter.     Procedures: No procedures performed   Clinical Data: No additional findings.   Subjective: Chief Complaint  Patient presents with  . Arm Pain    injured right arm after tripping over wood pile 09/16/17    HPI 52 year old female tripped over a wood pile that another family member had left on the porch in a position where it normally is not located.  She fell forward with her right arm out in front of her and contacted triceps region over the woodpile with ecchymosis increased pain and some muscle spasms.  She is concerned that she may have had fracture.  Review of Systems reviewed updated unchanged from last office visit.   Objective: Vital Signs: BP 122/78   Pulse 78   Ht 5' 6.5" (1.689 m)   Wt 128 lb (58.1 kg)   BMI 20.35 kg/m   Physical Exam  Constitutional: She is oriented to person, place, and time. She appears well-developed.  HENT:  Head: Normocephalic.  Right Ear: External ear normal.  Left Ear: External ear normal.  Eyes: Pupils are equal, round, and reactive to light.  Neck: No tracheal deviation present. No thyromegaly present.    Cardiovascular: Normal rate.  Pulmonary/Chest: Effort normal.  Abdominal: Soft.  Neurological: She is alert and oriented to person, place, and time.  Skin: Skin is warm and dry.  Psychiatric: She has a normal mood and affect. Her behavior is normal.    Ortho Exam well-healed lumbar incision.  Sensation her hand is intact.  She has soft tissue swelling with ecchymosis distal arm above the elbow and around the elbow.  She has some pain with pronation supination but no elbow effusion is noted.  Mild tenderness over the radial head.  Within 3 degrees of full extension flexes past 140 degrees.  Collateral ligaments are intact.  Sensation of the hand and fingers are normal.  Specialty Comments:  No specialty comments available.  Imaging: No results found.   PMFS History: Patient Active Problem List   Diagnosis Date Noted  . 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 Hypertension     Family History  Problem Relation Age of Onset  . Heart disease Mother   . Cancer Father        sarcoma in small intestine per pt  . Colon cancer Neg Hx   . Esophageal cancer Neg Hx   . Rectal  cancer Neg Hx   . Stomach cancer Neg Hx     Past Surgical History:  Procedure Laterality Date  . ANKLE SURGERY     January 2000  . BACK SURGERY    . HAND RECONSTRUCTION     right   Social History   Occupational History  . Not on file  Tobacco Use  . Smoking status: Former Smoker    Types: Cigarettes  . Smokeless tobacco: Never Used  . Tobacco comment: more than 10 years ago  Substance and Sexual Activity  . Alcohol use: Not on file    Comment: occasional vodka and soda  . Drug use: No  . Sexual activity: Not on file

## 2018-01-01 ENCOUNTER — Ambulatory Visit: Payer: 59 | Admitting: Physician Assistant

## 2018-01-12 ENCOUNTER — Other Ambulatory Visit (INDEPENDENT_AMBULATORY_CARE_PROVIDER_SITE_OTHER): Payer: Self-pay

## 2018-01-12 ENCOUNTER — Telehealth (INDEPENDENT_AMBULATORY_CARE_PROVIDER_SITE_OTHER): Payer: Self-pay

## 2018-01-12 MED ORDER — PREDNISONE 10 MG PO TABS: ORAL_TABLET | ORAL | 0 refills | Status: DC

## 2018-01-12 NOTE — Telephone Encounter (Signed)
Per Dr. Ophelia Charter, called in Prednisone  taper #[phone] to [facility].

## 2018-01-17 ENCOUNTER — Ambulatory Visit (INDEPENDENT_AMBULATORY_CARE_PROVIDER_SITE_OTHER): Payer: 59 | Admitting: Orthopaedic Surgery

## 2018-01-17 ENCOUNTER — Ambulatory Visit (INDEPENDENT_AMBULATORY_CARE_PROVIDER_SITE_OTHER): Payer: 59

## 2018-01-17 ENCOUNTER — Encounter (INDEPENDENT_AMBULATORY_CARE_PROVIDER_SITE_OTHER): Payer: Self-pay | Admitting: Orthopaedic Surgery

## 2018-01-17 VITALS — BP 99/67 | HR 87 | Ht 66.5 in | Wt 128.0 lb

## 2018-01-17 DIAGNOSIS — M5441 Lumbago with sciatica, right side: Secondary | ICD-10-CM

## 2018-01-17 NOTE — Progress Notes (Signed)
Office Visit Note   Patient: [name]           Date of Birth: [birth year]           MRN: [id number] Visit Date: 01/17/2018              Requested by: Richardean Chimera, MD [address] [place], [place] [address] PCP: Richardean Chimera, MD   Assessment & Plan: Visit Diagnoses:  1. Acute right-sided low back pain with right-sided sciatica     Plan: Right buttocks IM Depomedrol injection.   We will repeat her prednisone pack.  MRI scan with and without contrast to rule out L5-S1 disc recurrence.  Patient denies chills or fever.  No associated bowel or bladder symptoms.  Office follow-up after MRI scan.  She states her pain is severe, and affecting her daily with normal activities.  Follow-Up Instructions: No follow-ups on file.   Orders:  Orders Placed This Encounter  Procedures  . XR Lumbar Spine 2-3 Views  . MR Lumbar Spine W Wo Contrast   No orders of the defined types were placed in this encounter.     Procedures: No procedures performed   Clinical Data: No additional findings.   Subjective: Chief Complaint  Patient presents with  . Lower Back - Pain    S/p laminectomy L5-S1 approximately 12 years ago    HPI 52 year old female with 4-week history of significant low back pain right buttocks pain that radiates down the dorsum of her foot with numbness on the dorsum of weakness in her leg.  He walks 6 to 8 miles per day.  Previous L5-S1 microdiscectomy were prednisone taper gave her relief.  I did send in a prednisone taper 10 days ago with minimal relief.  She is used Tylenol Aleve and heating pad stretching exercises all without relief.  She rates her pain is severe.  She has problems sleeping at night.  Increased pain with flexion of the hips.  She has pain anteriorly in the groin radiates down to her thigh but also down the dorsum of the foot.  Review of Systems positive for varicose veins lower extremity.  L5-S1 H&P 2007.  Childbirth without problems.   Previous ankle reconstruction.   Objective: Vital Signs: BP 99/67 (BP Location: Right Arm, Patient Position: Sitting, Cuff Size: Normal)   Pulse 87   Ht 5' 6.5" (1.689 m)   Wt 128 lb (58.1 kg)   BMI 20.35 kg/m   Physical Exam  Constitutional: She is oriented to person, place, and time. She appears well-developed.  HENT:  Head: Normocephalic.  Right Ear: External ear normal.  Left Ear: External ear normal.  Eyes: Pupils are equal, round, and reactive to light.  Neck: No tracheal deviation present. No thyromegaly present.  Cardiovascular: Normal rate.  Pulmonary/Chest: Effort normal.  Abdominal: Soft.  Neurological: She is alert and oriented to person, place, and time.  Skin: Skin is warm and dry.  Psychiatric: She has a normal mood and affect. Her behavior is normal.    Ortho Exam patient has sciatic notch tenderness on the right some trochanteric bursal tenderness.  Radiates around to the ASIS.  No decrease sensation over LFCN  Distribution.  Patient has some pain with straight leg raising at 80 degrees on the right.  Some discomfort with figure-of-four.  No inguinal lymphadenopathy.  5 degrees internal/external rotation of both hips.  Right SI joint.  Pearlean Brownie test is negative.  Is well-healed at L5-S1.  Is able to  toe walk has some problems with heel walking on the right. EHL anterior tib resisted testing is normal.  Specialty Comments:  No specialty comments available.  Imaging: No results found.   PMFS History: Patient Active Problem List   Diagnosis Date Noted  . 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 Hypertension     Family History  Problem Relation Age of Onset  . Heart disease Mother   . Cancer Father        sarcoma in small intestine per pt  . Colon cancer Neg Hx   . Esophageal cancer Neg Hx   . Rectal cancer Neg Hx   . Stomach cancer Neg Hx     Past Surgical History:  Procedure Laterality Date  . ANKLE  SURGERY     January 2000  . BACK SURGERY    . HAND RECONSTRUCTION     right   Social History   Occupational History  . Not on file  Tobacco Use  . Smoking status: Former Smoker    Types: Cigarettes  . Smokeless tobacco: Never Used  . Tobacco comment: more than 10 years ago  Substance and Sexual Activity  . Alcohol use: Not on file    Comment: occasional vodka and soda  . Drug use: No  . Sexual activity: Not on file

## 2018-01-19 ENCOUNTER — Telehealth (INDEPENDENT_AMBULATORY_CARE_PROVIDER_SITE_OTHER): Payer: Self-pay | Admitting: Orthopaedic Surgery

## 2018-01-19 ENCOUNTER — Other Ambulatory Visit: Payer: Self-pay

## 2018-01-19 NOTE — Telephone Encounter (Signed)
OK for valium 5 mg # 3   . Take 2 po one hr before scan and take 3rd with you

## 2018-01-19 NOTE — Telephone Encounter (Signed)
[name]  [facility]  [phone] [address] [place] [address]   Med refill  Valium

## 2018-01-20 MED ORDER — DIAZEPAM 5 MG PO TABS: ORAL_TABLET | ORAL | 0 refills | Status: DC

## 2018-01-20 NOTE — Telephone Encounter (Signed)
Called to pharmacy 

## 2018-01-22 ENCOUNTER — Ambulatory Visit
Admission: RE | Admit: 2018-01-22 | Discharge: 2018-01-22 | Disposition: A | Discharge status: Home / Self Care | Payer: 59 | Source: Ambulatory Visit | Attending: Orthopaedic Surgery | Admitting: Orthopaedic Surgery

## 2018-01-22 DIAGNOSIS — M5441 Lumbago with sciatica, right side: Secondary | ICD-10-CM

## 2018-01-22 MED ORDER — GADOBENATE DIMEGLUMINE 529 MG/ML IV SOLN
11.0000 mL | Freq: Once | INTRAVENOUS | Status: AC | PRN
  Administered 2018-01-22: 11 mL via INTRAVENOUS

## 2018-01-24 ENCOUNTER — Ambulatory Visit (INDEPENDENT_AMBULATORY_CARE_PROVIDER_SITE_OTHER): Payer: 59 | Admitting: Physical Medicine and Rehabilitation

## 2018-01-24 ENCOUNTER — Telehealth (INDEPENDENT_AMBULATORY_CARE_PROVIDER_SITE_OTHER): Payer: Self-pay | Admitting: Physical Medicine and Rehabilitation

## 2018-01-24 ENCOUNTER — Encounter (INDEPENDENT_AMBULATORY_CARE_PROVIDER_SITE_OTHER): Payer: Self-pay | Admitting: Physical Medicine and Rehabilitation

## 2018-01-24 ENCOUNTER — Ambulatory Visit (INDEPENDENT_AMBULATORY_CARE_PROVIDER_SITE_OTHER): Payer: 59

## 2018-01-24 VITALS — BP 106/78 | HR 113

## 2018-01-24 DIAGNOSIS — M961 Postlaminectomy syndrome, not elsewhere classified: Secondary | ICD-10-CM | POA: Diagnosis not present

## 2018-01-24 DIAGNOSIS — M5416 Radiculopathy, lumbar region: Secondary | ICD-10-CM | POA: Diagnosis not present

## 2018-01-24 MED ORDER — BETAMETHASONE SOD PHOS & ACET 6 (3-3) MG/ML IJ SUSP
12.0000 mg | Freq: Once | INTRAMUSCULAR | Status: AC
  Administered 2018-01-24: 12 mg

## 2018-01-24 NOTE — Progress Notes (Signed)
[name] - 52 y.o. female MRN [id number]  Date of birth: 1966/09/08  Office Visit Note: Visit Date: 01/24/2018 PCP: Richardean Chimera, MD Referred by: Richardean Chimera, MD  Subjective: Chief Complaint  Patient presents with  . Lower Back - Pain  . Right Hip - Pain  . Right Leg - Pain   HPI: [name] is a 52 year old female who is accompanied by her husband who provides some of the history.  She has been a patient of Dr. Ophelia Charter for quite some time and has a remote history of left laminectomy at L5-S1.  She comes in today at his request for urgent work and there is severe right radicular type leg pain down into the anterolateral aspect of the calf and top of the foot.  This is somewhat of a more L5 distribution.  She endorses some paresthesia and pain which has not responded to oxycodone or tramadol or Valium.  She has new MRI with contrast which is reviewed today.  No real overt findings on the MRI to explain the right radicular pain.  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  This is seen better on fluoroscopic imaging.  We are going to complete a right L5 transforaminal epidural steroid injection from a diagnostic and hopefully therapeutic standpoint.   ROS Otherwise per HPI.  Assessment & Plan: Visit Diagnoses:  1. Lumbar radiculopathy   2. Post laminectomy syndrome     Plan: No additional findings.   Meds & Orders:  Meds ordered this encounter  Medications  . betamethasone acetate-betamethasone sodium phosphate (CELESTONE) injection 12 mg    Orders Placed This Encounter  Procedures  . XR C-ARM NO REPORT  . Epidural Steroid injection    Follow-up: Return if symptoms worsen or fail to improve, for Dr. Ophelia Charter.   Procedures: No procedures performed  Lumbosacral Transforaminal Epidural Steroid Injection - Sub-Pedicular Approach with Fluoroscopic Guidance  Patient: [name]      Date of Birth:  01/19/1966 MRN: [id number] PCP: Richardean Chimera, MD      Visit Date: 01/24/2018   Universal Protocol:    Date/Time: 01/24/2018  Consent Given By: the patient  Position: PRONE  Additional Comments: Vital signs were monitored before and after the procedure. Patient was prepped and draped in the usual sterile fashion. The correct patient, procedure, and site was verified.   Injection Procedure Details:  Procedure Site One Meds Administered:  Meds ordered this encounter  Medications  . betamethasone acetate-betamethasone sodium phosphate (CELESTONE) injection 12 mg    Laterality: Right  Location/Site:  L5-S1  Needle size: 22 G  Needle type: Spinal  Needle Placement: Transforaminal  Findings:    -Comments: Excellent flow of contrast along the nerve and into the epidural space.  Procedure Details: After squaring off the end-plates to get a true AP view, the C-arm was positioned so that an oblique view of the foramen as noted above was visualized. The target area is just inferior to the "nose of the scotty dog" or sub pedicular. The soft tissues overlying this structure were infiltrated with 2-3 ml. of 1% Lidocaine without Epinephrine.  The spinal needle was inserted toward the target using a "trajectory" view along the fluoroscope beam.  Under AP and lateral visualization, the needle was advanced so it did not puncture dura and was located close the 6 O'Clock position of the pedical in AP tracterory. Biplanar projections were used to confirm position. Aspiration  was confirmed to be negative for CSF and/or blood. A 1-2 ml. volume of Isovue-250 was injected and flow of contrast was noted at each level. Radiographs were obtained for documentation purposes.   After attaining the desired flow of contrast documented above, a 0.5 to 1.0 ml test dose of 0.25% Marcaine was injected into each respective transforaminal space.  The patient was observed for 90 seconds post injection.  After no  sensory deficits were reported, and normal lower extremity motor function was noted,   the above injectate was administered so that equal amounts of the injectate were placed at each foramen (level) into the transforaminal epidural space.   Additional Comments:  The patient tolerated the procedure well Dressing: Band-Aid    Post-procedure details: Patient was observed during the procedure. Post-procedure instructions were reviewed.  Patient left the clinic in stable condition.    Clinical History: MRI LUMBAR SPINE WITHOUT AND WITH CONTRAST  TECHNIQUE: Multiplanar and multiecho pulse sequences of the lumbar spine were obtained without and with intravenous contrast.  CONTRAST:  11mL MULTIHANCE GADOBENATE DIMEGLUMINE 529 MG/ML IV SOLN  COMPARISON:  Radiography 12/11/2005.  MRI 11/26/2005.  FINDINGS: Segmentation:  5 lumbar type vertebral bodies.  Alignment: Normal in the lumbar region. Lower thoracic curvature convex to the left of a mild degree is noted.  Vertebrae: No significant finding. Benign appearing hemangiomas most notable at T12. Chronic discogenic endplate changes at L5-S1 without edema or enhancement presently.  Conus medullaris and cauda equina: Conus extends to the upper L1 level. Conus and cauda equina appear normal.  Paraspinal and other soft tissues: Normal  Disc levels:  Mild non-compressive disc bulge at T11-12. No significant finding from T12-L1 through L3-4. No disc herniation or stenosis. No evidence of facet arthropathy.  L4-5: Minimal disc bulge. No stenosis or neural compression. No significant facet arthropathy.  L5-S1: Chronic disc degeneration. Previous left hemilaminectomy. Mild bulging of the disc. No unexpected epidural fibrosis. No canal or foraminal stenosis. Chronic endplate marrow changes without edema or enhancement.  IMPRESSION: Distant left hemilaminectomy and discectomy at L5-S1 without evidence of residual or  recurrent disc herniation. Chronic discogenic endplate marrow changes at this level, but without edema or enhancement presently to suggest symptomatic disease. No stenosis presently.  Negative at L4-5 and above other than a minimal disc bulge at L4-5.  The examination does not show an explanation of pain radiating to the right hip or right leg weakness and numbness.   Electronically Signed   By: Paulina Fusi M.D.   On: 01/23/2018 07:22   She reports that she has quit smoking. Her smoking use included cigarettes. She has never used smokeless tobacco. No results for input(s): HGBA1C, LABURIC in the last 8760 hours.  Objective:  VS:  HT:    WT:   BMI:     BP:106/78  HR:(!) 113bpm  TEMP: ( )  RESP:  Physical Exam  Musculoskeletal:  Patient walks without aid with good distal strength.    Ortho Exam Imaging: Xr C-arm No Report  Result Date: 01/24/2018 Please see Notes or Procedures tab for imaging impression.   Past Medical/Family/Surgical/Social History: Medications & Allergies reviewed per EMR, new medications updated. Patient Active Problem List   Diagnosis Date Noted  . 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 Hypertension    Family History  Problem Relation Age of Onset  . Heart disease Mother   . Cancer Father  sarcoma in small intestine per pt  . Colon cancer Neg Hx   . Esophageal cancer Neg Hx   . Rectal cancer Neg Hx   . Stomach cancer Neg Hx    Past Surgical History:  Procedure Laterality Date  . ANKLE SURGERY     January 2000  . BACK SURGERY    . HAND RECONSTRUCTION     right   Social History   Occupational History  . Not on file  Tobacco Use  . Smoking status: Former Smoker    Types: Cigarettes  . Smokeless tobacco: Never Used  . Tobacco comment: more than 10 years ago  Substance and Sexual Activity  . Alcohol use: Not on file    Comment: occasional vodka and soda  . Drug use: No    . Sexual activity: Not on file

## 2018-01-24 NOTE — Telephone Encounter (Signed)
Right L5 TF esi 15 min, tell if working in will have to be fairly quick and she can follow with Dr. Ophelia Charter. MRI does not show any issue FYI by report

## 2018-01-24 NOTE — Procedures (Signed)
Lumbosacral Transforaminal Epidural Steroid Injection - Sub-Pedicular Approach with Fluoroscopic Guidance  Patient: [name]      Date of Birth: 05/25/1966 MRN: [id number] PCP: Richardean Chimera, MD      Visit Date: 01/24/2018   Universal Protocol:    Date/Time: 01/24/2018  Consent Given By: the patient  Position: PRONE  Additional Comments: Vital signs were monitored before and after the procedure. Patient was prepped and draped in the usual sterile fashion. The correct patient, procedure, and site was verified.   Injection Procedure Details:  Procedure Site One Meds Administered:  Meds ordered this encounter  Medications  . betamethasone acetate-betamethasone sodium phosphate (CELESTONE) injection 12 mg    Laterality: Right  Location/Site:  L5-S1  Needle size: 22 G  Needle type: Spinal  Needle Placement: Transforaminal  Findings:    -Comments: Excellent flow of contrast along the nerve and into the epidural space.  Procedure Details: After squaring off the end-plates to get a true AP view, the C-arm was positioned so that an oblique view of the foramen as noted above was visualized. The target area is just inferior to the "nose of the scotty dog" or sub pedicular. The soft tissues overlying this structure were infiltrated with 2-3 ml. of 1% Lidocaine without Epinephrine.  The spinal needle was inserted toward the target using a "trajectory" view along the fluoroscope beam.  Under AP and lateral visualization, the needle was advanced so it did not puncture dura and was located close the 6 O'Clock position of the pedical in AP tracterory. Biplanar projections were used to confirm position. Aspiration was confirmed to be negative for CSF and/or blood. A 1-2 ml. volume of Isovue-250 was injected and flow of contrast was noted at each level. Radiographs were obtained for documentation purposes.   After attaining the desired flow of contrast documented above, a 0.5 to 1.0  ml test dose of 0.25% Marcaine was injected into each respective transforaminal space.  The patient was observed for 90 seconds post injection.  After no sensory deficits were reported, and normal lower extremity motor function was noted,   the above injectate was administered so that equal amounts of the injectate were placed at each foramen (level) into the transforaminal epidural space.   Additional Comments:  The patient tolerated the procedure well Dressing: Band-Aid    Post-procedure details: Patient was observed during the procedure. Post-procedure instructions were reviewed.  Patient left the clinic in stable condition.

## 2018-01-24 NOTE — Patient Instructions (Signed)

## 2018-01-24 NOTE — Progress Notes (Signed)
 .  Numeric Pain Rating Scale and Functional Assessment Average Pain 7   In the last MONTH (on 0-10 scale) has pain interfered with the following?  1. General activity like being  able to carry out your everyday physical activities such as walking, climbing stairs, carrying groceries, or moving a chair?  Rating(6)   +Driver, -BT, -Dye Allergies.  

## 2018-02-02 ENCOUNTER — Telehealth (INDEPENDENT_AMBULATORY_CARE_PROVIDER_SITE_OTHER): Payer: Self-pay | Admitting: Radiology

## 2018-02-02 ENCOUNTER — Ambulatory Visit (INDEPENDENT_AMBULATORY_CARE_PROVIDER_SITE_OTHER): Payer: 59 | Admitting: Orthopaedic Surgery

## 2018-02-02 ENCOUNTER — Encounter (INDEPENDENT_AMBULATORY_CARE_PROVIDER_SITE_OTHER): Payer: Self-pay | Admitting: Orthopaedic Surgery

## 2018-02-02 VITALS — BP 120/84 | HR 97 | Ht 66.5 in | Wt 128.0 lb

## 2018-02-02 DIAGNOSIS — M5441 Lumbago with sciatica, right side: Secondary | ICD-10-CM | POA: Diagnosis not present

## 2018-02-02 MED ORDER — TRAMADOL HCL 50 MG PO TABS: 50.0000 mg | ORAL_TABLET | Freq: Two times a day (BID) | ORAL | 0 refills | Status: DC | PRN

## 2018-02-02 MED ORDER — GABAPENTIN 100 MG PO CAPS: 100.0000 mg | ORAL_CAPSULE | Freq: Every day | ORAL | 0 refills | Status: DC

## 2018-02-02 NOTE — Telephone Encounter (Signed)
Ok proceed. thanks 

## 2018-02-02 NOTE — Addendum Note (Signed)
Addended by: Rogers Seeds on: 02/02/2018 01:20 PM   Modules accepted: Orders

## 2018-02-02 NOTE — Progress Notes (Signed)
Office Visit Note   Patient: [name]           Date of Birth: September 21, 1965           MRN: [id number] Visit Date: 02/02/2018              Requested by: Richardean Chimera, MD [address] [place], [place] [address] PCP: Richardean Chimera, MD   Assessment & Plan: Visit Diagnoses:  1. Acute right-sided low back pain with right-sided sciatica     Plan: Patient had surgery in 2007 MRI scan shows endplate discogenic changes with edema and no recurrent compression on the left at L5-S1.  She is having pain on the right side and she does have some slight narrowing not noted on MRI scan but present on plain radiographs and the foramina.  She exercises on a daily basis sometimes walking 4 to 6 miles per day.  She got great relief with the epidural with Dr. Alvester Morin.  Will re-repeat this.  She states she is ready to proceed with surgery even if she has to pay out-of-pocket committed she try some Neurontin 100 mg for 3 to 7 days then increase to the 200 mg.  Daily.  She asked about spine fusion we also discussed disc arthroplasty at L5-S1.  Long discussion with patient husband was performed and I would recommend we go slow on this and try another injection and see if her symptoms will settle down.  She did not have nerve root compression on imaging studies.  She has not had a myelogram CT scan which is an option she does not respond to medication.  Her husband asked about seeing a neurosurgeon and I discussed I am happy to make the referral.  Can check her back again after.  She is had difficulty not being able to exercise as much as she normally does and we reviewed the MRI scan that showed some discogenic endplate changes likely causing her symptoms area she will see Dr. Alvester Morin for second injection.  Continuing to occasionally take some Ultram and hopefully she will respond to the Neurontin repeat epidural.  Questions were elicited and answered we reviewed the MRI in detail as well as previous MRI scan  compared the old scan where she had disc herniation with significant nerve compression as well as her current scan.  Long discussion on pathophysiology.  See me after epidural.  Follow-Up Instructions: No follow-ups on file.   Orders:  Orders Placed This Encounter  Procedures  . Ambulatory referral to Physical Medicine Rehab   Meds ordered this encounter  Medications  . gabapentin (NEURONTIN) 100 MG capsule    Sig: Take 1 capsule (100 mg total) by mouth at bedtime.    Dispense:  60 capsule    Refill:  0  . traMADol (ULTRAM) 50 MG tablet    Sig: Take 1 tablet (50 mg total) by mouth 2 (two) times daily as needed for moderate pain.    Dispense:  30 tablet    Refill:  0      Procedures: No procedures performed   Clinical Data: No additional findings.   Subjective: Chief Complaint  Patient presents with  . Lower Back - Pain, Follow-up    HPI 52 year old female returns with ongoing problems with severe back pain buttocks pain on the right that radiates into her leg.  She had an epidural which gave her great pain relief but only lasted maybe a week.  Normally walks 6 miles a day  and also does workout classes regularly.  Unable to walk more than 1 to 2 miles increased pain and states "take this anymore".  She has been tearful gets relief with supine position but with decreased activity has had some situational depression.  Her husband came with her today.  Previous surgery by me at L5-S1 on the left 2007 with good results.  Did well for many years and then had recurrence of pain that started in late March.  She is had a prednisone taper without relief.  Epidural gave her a week or so of great relief and then recurrence.  Pain radiates from her back into her hip buttocks into her thigh and down to her foot at times onto the dorsum.  She has had previous ankle reconstruction doing well.  No associated bowel or bladder symptoms no chills or fever.  MRI scan is available for review today we  went over this with the patient husband and I gave him a copy of the results.  Patient's been on tramadol for the pain.  She is used topical creams, heating pad rest traction, stretching.  Review of Systems previous ankle reconstruction 2000 doing well.  Microdiscectomy left L5-S1 2007 no endocrine respiratory cardiovascular GYN neurologic does take medication for hypertension.  Klonopin 0.5 mg daily..  14 point review of systems otherwise negative.  Objective: Vital Signs: BP 120/84   Pulse 97   Ht 5' 6.5" (1.689 m)   Wt 128 lb (58.1 kg)   BMI 20.35 kg/m   Physical Exam  Constitutional: She is oriented to person, place, and time. She appears well-developed.  HENT:  Head: Normocephalic.  Right Ear: External ear normal.  Left Ear: External ear normal.  Eyes: Pupils are equal, round, and reactive to light.  Neck: No tracheal deviation present. No thyromegaly present.  Cardiovascular: Normal rate.  Pulmonary/Chest: Effort normal.  Abdominal: Soft.  Neurological: She is alert and oriented to person, place, and time.  Skin: Skin is warm and dry.  Psychiatric: She has a normal mood and affect. Her behavior is normal.    Ortho Exam patient is able to ambulate heel and toe walk.  She has increased sciatic notch tenderness on the right negative on the left.  Mild trochanteric bursal tenderness.  Normal hip range of motion.  Normal pulses no venous stasis changes.  Reflexes are 2+ and symmetrical.  SI joint testing is negative.  Distal pulses are normal.  Excellent symmetrical lower extremity strength.  Good abdominal strength.  Specialty Comments:  No specialty comments available.  Imaging: CLINICAL DATA:  Low back pain radiating to the right hip. Weakness and numbness in the legs.  Creatinine was obtained on site at [facility] at [address]  Results: Creatinine 0.9 mg/dL.  EXAM: MRI LUMBAR SPINE WITHOUT AND WITH CONTRAST  TECHNIQUE: Multiplanar and  multiecho pulse sequences of the lumbar spine were obtained without and with intravenous contrast.  CONTRAST:  11mL MULTIHANCE GADOBENATE DIMEGLUMINE 529 MG/ML IV SOLN  COMPARISON:  Radiography 12/11/2005.  MRI 11/26/2005.  FINDINGS: Segmentation:  5 lumbar type vertebral bodies.  Alignment: Normal in the lumbar region. Lower thoracic curvature convex to the left of a mild degree is noted.  Vertebrae: No significant finding. Benign appearing hemangiomas most notable at T12. Chronic discogenic endplate changes at L5-S1 without edema or enhancement presently.  Conus medullaris and cauda equina: Conus extends to the upper L1 level. Conus and cauda equina appear normal.  Paraspinal and other soft tissues: Normal  Disc levels:  Mild non-compressive disc bulge at T11-12. No significant finding from T12-L1 through L3-4. No disc herniation or stenosis. No evidence of facet arthropathy.  L4-5: Minimal disc bulge. No stenosis or neural compression. No significant facet arthropathy.  L5-S1: Chronic disc degeneration. Previous left hemilaminectomy. Mild bulging of the disc. No unexpected epidural fibrosis. No canal or foraminal stenosis. Chronic endplate marrow changes without edema or enhancement.  IMPRESSION: Distant left hemilaminectomy and discectomy at L5-S1 without evidence of residual or recurrent disc herniation. Chronic discogenic endplate marrow changes at this level, but without edema or enhancement presently to suggest symptomatic disease. No stenosis presently.  Negative at L4-5 and above other than a minimal disc bulge at L4-5.  The examination does not show an explanation of pain radiating to the right hip or right leg weakness and numbness.   Electronically Signed   By: Paulina Fusi M.D.   On: 01/23/2018 07:22    PMFS History: Patient Active Problem List   Diagnosis Date Noted  . Acute right-sided low back pain with right-sided sciatica  02/03/2018  . 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 Hypertension     Family History  Problem Relation Age of Onset  . Heart disease Mother   . Cancer Father        sarcoma in small intestine per pt  . Colon cancer Neg Hx   . Esophageal cancer Neg Hx   . Rectal cancer Neg Hx   . Stomach cancer Neg Hx     Past Surgical History:  Procedure Laterality Date  . ANKLE SURGERY     January 2000  . BACK SURGERY    . HAND RECONSTRUCTION     right   Social History   Occupational History  . Not on file  Tobacco Use  . Smoking status: Former Smoker    Types: Cigarettes  . Smokeless tobacco: Never Used  . Tobacco comment: more than 10 years ago  Substance and Sexual Activity  . Alcohol use: Not on file    Comment: occasional vodka and soda  . Drug use: No  . Sexual activity: Not on file

## 2018-02-02 NOTE — Telephone Encounter (Signed)
Referral placed into system.  I called patient and advised. I did explain that referral would be entered and information would be pulled and faxed from the referral que. I also explained to patient that neurosurgeon would review records and decide on appointment. Their office will call to schedule.

## 2018-02-02 NOTE — Telephone Encounter (Signed)
Patient's husband would like for her to get a second opinion from a neurosurgeon in regards to her back pain. They are completely comfortable with what you explained to them, however, she is in a lot of pain and they would like to get the ball rolling if possible. She has heard good things about Dr. Jordan Likes or Dr. Wynetta Emery, but would like to see whoever you recommend.  Please advise on referral.

## 2018-02-03 ENCOUNTER — Encounter (INDEPENDENT_AMBULATORY_CARE_PROVIDER_SITE_OTHER): Payer: Self-pay | Admitting: Orthopaedic Surgery

## 2018-02-03 DIAGNOSIS — M5441 Lumbago with sciatica, right side: Secondary | ICD-10-CM | POA: Insufficient documentation

## 2018-02-17 ENCOUNTER — Other Ambulatory Visit: Payer: Self-pay | Admitting: Neurosurgery

## 2018-02-17 ENCOUNTER — Telehealth (INDEPENDENT_AMBULATORY_CARE_PROVIDER_SITE_OTHER): Payer: Self-pay | Admitting: Radiology

## 2018-02-17 DIAGNOSIS — M5416 Radiculopathy, lumbar region: Secondary | ICD-10-CM

## 2018-02-17 MED ORDER — TRAMADOL HCL 50 MG PO TABS: 50.0000 mg | ORAL_TABLET | Freq: Two times a day (BID) | ORAL | 0 refills | Status: AC

## 2018-02-17 NOTE — Telephone Encounter (Signed)
Patient requests refill on Tramadol.  Verbal ok from Dr. Ophelia CharterYates. Called to pharmacy. Patient advised.

## 2018-02-24 ENCOUNTER — Ambulatory Visit (INDEPENDENT_AMBULATORY_CARE_PROVIDER_SITE_OTHER): Payer: 59 | Admitting: Physical Medicine and Rehabilitation

## 2018-02-24 ENCOUNTER — Encounter

## 2018-02-24 ENCOUNTER — Ambulatory Visit (INDEPENDENT_AMBULATORY_CARE_PROVIDER_SITE_OTHER): Payer: 59

## 2018-02-24 ENCOUNTER — Encounter (INDEPENDENT_AMBULATORY_CARE_PROVIDER_SITE_OTHER): Payer: Self-pay | Admitting: Physical Medicine and Rehabilitation

## 2018-02-24 VITALS — BP 110/67 | HR 62

## 2018-02-24 DIAGNOSIS — M5416 Radiculopathy, lumbar region: Secondary | ICD-10-CM | POA: Diagnosis not present

## 2018-02-24 DIAGNOSIS — M961 Postlaminectomy syndrome, not elsewhere classified: Secondary | ICD-10-CM | POA: Diagnosis not present

## 2018-02-24 MED ORDER — BETAMETHASONE SOD PHOS & ACET 6 (3-3) MG/ML IJ SUSP
12.0000 mg | Freq: Once | INTRAMUSCULAR | Status: AC
  Administered 2018-02-24: 12 mg

## 2018-02-24 NOTE — Progress Notes (Signed)
 .  Numeric Pain Rating Scale and Functional Assessment Average Pain 7   In the last MONTH (on 0-10 scale) has pain interfered with the following?  1. General activity like being  able to carry out your everyday physical activities such as walking, climbing stairs, carrying groceries, or moving a chair?  Rating(5)   +Driver, -BT, -Dye Allergies.  

## 2018-02-24 NOTE — Patient Instructions (Signed)

## 2018-02-24 NOTE — Procedures (Signed)
Lumbosacral Transforaminal Epidural Steroid Injection - Sub-Pedicular Approach with Fluoroscopic Guidance  Patient: [name]      Date of Birth: Jan 18, 1966 MRN: [id number] PCP: Richardean ChimeraMcComb, John, MD      Visit Date: 02/24/2018   Universal Protocol:    Date/Time: 02/24/2018  Consent Given By: the patient  Position: PRONE  Additional Comments: Vital signs were monitored before and after the procedure. Patient was prepped and draped in the usual sterile fashion. The correct patient, procedure, and site was verified.   Injection Procedure Details:  Procedure Site One Meds Administered:  Meds ordered this encounter  Medications  . betamethasone acetate-betamethasone sodium phosphate (CELESTONE) injection 12 mg    Laterality: Right  Location/Site:  L4-L5  Needle size: 22 G  Needle type: Spinal  Needle Placement: Transforaminal  Findings:    -Comments: Excellent flow of contrast along the nerve and into the epidural space.  Procedure Details: After squaring off the end-plates to get a true AP view, the C-arm was positioned so that an oblique view of the foramen as noted above was visualized. The target area is just inferior to the "nose of the scotty dog" or sub pedicular. The soft tissues overlying this structure were infiltrated with 2-3 ml. of 1% Lidocaine without Epinephrine.  The spinal needle was inserted toward the target using a "trajectory" view along the fluoroscope beam.  Under AP and lateral visualization, the needle was advanced so it did not puncture dura and was located close the 6 O'Clock position of the pedical in AP tracterory. Biplanar projections were used to confirm position. Aspiration was confirmed to be negative for CSF and/or blood. A 1-2 ml. volume of Isovue-250 was injected and flow of contrast was noted at each level. Radiographs were obtained for documentation purposes.   After attaining the desired flow of contrast documented above, a 0.5 to 1.0  ml test dose of 0.25% Marcaine was injected into each respective transforaminal space.  The patient was observed for 90 seconds post injection.  After no sensory deficits were reported, and normal lower extremity motor function was noted,   the above injectate was administered so that equal amounts of the injectate were placed at each foramen (level) into the transforaminal epidural space.   Additional Comments:  The patient tolerated the procedure well Dressing: Band-Aid    Post-procedure details: Patient was observed during the procedure. Post-procedure instructions were reviewed.  Patient left the clinic in stable condition.

## 2018-02-25 NOTE — Progress Notes (Signed)
[name] - 52 y.o. female MRN [id number]  Date of birth: [birth year]  Office Visit Note: Visit Date: 02/24/2018 PCP: Richardean Chimera, MD Referred by: Richardean Chimera, MD  Subjective: Chief Complaint  Patient presents with  . Right Hip - Pain   HPI: [name] is a 52 year old female with now chronic worsening right hip and leg pain that has been ongoing now for 8 weeks.  She is status post prior hemilaminectomy on the right at L5-S1.  She has been followed by Dr. Annell Greening.  She is now sought a second opinion with Dr. Wynetta Emery at  [facility].  Per his notes he recommended diagnostic right L4 transforaminal epidural steroid injection.  In brief we saw her on 01/24/2018 and completed right L5 transforaminal epidural steroid injection.  During the diagnostic phase of that injection she had near complete relief according to her.  She reports worsening pain with standing and sitting.  She reports decreased pain at night with sleeping.  She reports average pain is a 7 out of 10.  She has had no focal weakness.  She has had acupuncture as well as chiropractic care both giving her great relief but only for short-term.  MRI findings have been minimal.  She has not had electrodiagnostic study.  She does have some L5-S1 facet joint arthritic change.  This is better seen on x-ray.  She is going to follow-up with Dr. Wynetta Emery in 2 weeks.   ROS Otherwise per HPI.  Assessment & Plan: Visit Diagnoses:  1. Lumbar radiculopathy   2. Post laminectomy syndrome     Plan: No additional findings.   Meds & Orders:  Meds ordered this encounter  Medications  . betamethasone acetate-betamethasone sodium phosphate (CELESTONE) injection 12 mg    Orders Placed This Encounter  Procedures  . XR C-ARM NO REPORT  . Epidural Steroid injection    Follow-up: Return if symptoms worsen or fail to improve, for Dr. Wynetta Emery, Dr. Ophelia Charter.   Procedures: No procedures performed  Lumbosacral  Transforaminal Epidural Steroid Injection - Sub-Pedicular Approach with Fluoroscopic Guidance  Patient: [name]      Date of Birth: 22-May-1966 MRN: [id number] PCP: Richardean Chimera, MD      Visit Date: 02/24/2018   Universal Protocol:    Date/Time: 02/24/2018  Consent Given By: the patient  Position: PRONE  Additional Comments: Vital signs were monitored before and after the procedure. Patient was prepped and draped in the usual sterile fashion. The correct patient, procedure, and site was verified.   Injection Procedure Details:  Procedure Site One Meds Administered:  Meds ordered this encounter  Medications  . betamethasone acetate-betamethasone sodium phosphate (CELESTONE) injection 12 mg    Laterality: Right  Location/Site:  L4-L5  Needle size: 22 G  Needle type: Spinal  Needle Placement: Transforaminal  Findings:    -Comments: Excellent flow of contrast along the nerve and into the epidural space.  Procedure Details: After squaring off the end-plates to get a true AP view, the C-arm was positioned so that an oblique view of the foramen as noted above was visualized. The target area is just inferior to the "nose of the scotty dog" or sub pedicular. The soft tissues overlying this structure were infiltrated with 2-3 ml. of 1% Lidocaine without Epinephrine.  The spinal needle was inserted toward the target using a "trajectory" view along the fluoroscope beam.  Under AP and lateral visualization, the needle was advanced so it did not puncture dura and was  located close the 6 O'Clock position of the pedical in AP tracterory. Biplanar projections were used to confirm position. Aspiration was confirmed to be negative for CSF and/or blood. A 1-2 ml. volume of Isovue-250 was injected and flow of contrast was noted at each level. Radiographs were obtained for documentation purposes.   After attaining the desired flow of contrast documented above, a 0.5 to 1.0 ml test dose  of 0.25% Marcaine was injected into each respective transforaminal space.  The patient was observed for 90 seconds post injection.  After no sensory deficits were reported, and normal lower extremity motor function was noted,   the above injectate was administered so that equal amounts of the injectate were placed at each foramen (level) into the transforaminal epidural space.   Additional Comments:  The patient tolerated the procedure well Dressing: Band-Aid    Post-procedure details: Patient was observed during the procedure. Post-procedure instructions were reviewed.  Patient left the clinic in stable condition.    Clinical History: MRI LUMBAR SPINE WITHOUT AND WITH CONTRAST  TECHNIQUE: Multiplanar and multiecho pulse sequences of the lumbar spine were obtained without and with intravenous contrast.  CONTRAST:  11mL MULTIHANCE GADOBENATE DIMEGLUMINE 529 MG/ML IV SOLN  COMPARISON:  Radiography 12/11/2005.  MRI 11/26/2005.  FINDINGS: Segmentation:  5 lumbar type vertebral bodies.  Alignment: Normal in the lumbar region. Lower thoracic curvature convex to the left of a mild degree is noted.  Vertebrae: No significant finding. Benign appearing hemangiomas most notable at T12. Chronic discogenic endplate changes at L5-S1 without edema or enhancement presently.  Conus medullaris and cauda equina: Conus extends to the upper L1 level. Conus and cauda equina appear normal.  Paraspinal and other soft tissues: Normal  Disc levels:  Mild non-compressive disc bulge at T11-12. No significant finding from T12-L1 through L3-4. No disc herniation or stenosis. No evidence of facet arthropathy.  L4-5: Minimal disc bulge. No stenosis or neural compression. No significant facet arthropathy.  L5-S1: Chronic disc degeneration. Previous left hemilaminectomy. Mild bulging of the disc. No unexpected epidural fibrosis. No canal or foraminal stenosis. Chronic endplate marrow  changes without edema or enhancement.  IMPRESSION: Distant left hemilaminectomy and discectomy at L5-S1 without evidence of residual or recurrent disc herniation. Chronic discogenic endplate marrow changes at this level, but without edema or enhancement presently to suggest symptomatic disease. No stenosis presently.  Negative at L4-5 and above other than a minimal disc bulge at L4-5.  The examination does not show an explanation of pain radiating to the right hip or right leg weakness and numbness.   Electronically Signed   By: Paulina Fusi M.D.   On: 01/23/2018 07:22   She reports that she has quit smoking. Her smoking use included cigarettes. She has never used smokeless tobacco. No results for input(s): HGBA1C, LABURIC in the last 8760 hours.  Objective:  VS:  HT:    WT:   BMI:     BP:110/67  HR:62bpm  TEMP: ( )  RESP:  Physical Exam  Ortho Exam Imaging: Xr C-arm No Report  Result Date: 02/24/2018 Please see Notes or Procedures tab for imaging impression.   Past Medical/Family/Surgical/Social History: Medications & Allergies reviewed per EMR, new medications updated. Patient Active Problem List   Diagnosis Date Noted  . Acute right-sided low back pain with right-sided sciatica 02/03/2018  . 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 Hypertension    Family History  Problem Relation Age  of Onset  . Heart disease Mother   . Cancer Father        sarcoma in small intestine per pt  . Colon cancer Neg Hx   . Esophageal cancer Neg Hx   . Rectal cancer Neg Hx   . Stomach cancer Neg Hx    Past Surgical History:  Procedure Laterality Date  . ANKLE SURGERY     January 2000  . BACK SURGERY    . HAND RECONSTRUCTION     right   Social History   Occupational History  . Not on file  Tobacco Use  . Smoking status: Former Smoker    Types: Cigarettes  . Smokeless tobacco: Never Used  . Tobacco comment: more  than 10 years ago  Substance and Sexual Activity  . Alcohol use: Not on file    Comment: occasional vodka and soda  . Drug use: No  . Sexual activity: Not on file

## 2018-03-01 ENCOUNTER — Other Ambulatory Visit: Payer: 59

## 2018-03-10 ENCOUNTER — Ambulatory Visit (INDEPENDENT_AMBULATORY_CARE_PROVIDER_SITE_OTHER): Payer: 59 | Admitting: Physical Medicine and Rehabilitation

## 2018-03-10 ENCOUNTER — Ambulatory Visit (INDEPENDENT_AMBULATORY_CARE_PROVIDER_SITE_OTHER): Payer: Self-pay

## 2018-03-10 ENCOUNTER — Encounter (INDEPENDENT_AMBULATORY_CARE_PROVIDER_SITE_OTHER): Payer: Self-pay | Admitting: Physical Medicine and Rehabilitation

## 2018-03-10 VITALS — BP 102/63 | HR 97

## 2018-03-10 DIAGNOSIS — M5416 Radiculopathy, lumbar region: Secondary | ICD-10-CM | POA: Diagnosis not present

## 2018-03-10 MED ORDER — BETAMETHASONE SOD PHOS & ACET 6 (3-3) MG/ML IJ SUSP
12.0000 mg | Freq: Once | INTRAMUSCULAR | Status: AC
  Administered 2018-03-10: 12 mg

## 2018-03-10 NOTE — Patient Instructions (Signed)

## 2018-03-10 NOTE — Progress Notes (Signed)
   Numeric Pain Rating Scale and Functional Assessment Average Pain 4   In the last MONTH (on 0-10 scale) has pain interfered with the following?  1. General activity like being  able to carry out your everyday physical activities such as walking, climbing stairs, carrying groceries, or moving a chair?  Rating(8)   +Driver, +BT, -Dye Allergies.  

## 2018-03-10 NOTE — Procedures (Signed)
Lumbosacral Transforaminal Epidural Steroid Injection - Sub-Pedicular Approach with Fluoroscopic Guidance  Patient: [name]      Date of Birth: March 16, 1966 MRN: [id number] PCP: Richardean ChimeraMcComb, John, MD      Visit Date: 03/10/2018   Universal Protocol:    Date/Time: 03/10/2018  Consent Given By: the patient  Position: PRONE  Additional Comments: Vital signs were monitored before and after the procedure. Patient was prepped and draped in the usual sterile fashion. The correct patient, procedure, and site was verified.   Injection Procedure Details:  Procedure Site One Meds Administered:  Meds ordered this encounter  Medications  . betamethasone acetate-betamethasone sodium phosphate (CELESTONE) injection 12 mg    Laterality: Right  Location/Site:  L4-L5  Needle size: 22 G  Needle type: Spinal  Needle Placement: Transforaminal  Findings:    -Comments: Excellent flow of contrast along the nerve and into the epidural space.  Procedure Details: After squaring off the end-plates to get a true AP view, the C-arm was positioned so that an oblique view of the foramen as noted above was visualized. The target area is just inferior to the "nose of the scotty dog" or sub pedicular. The soft tissues overlying this structure were infiltrated with 2-3 ml. of 1% Lidocaine without Epinephrine.  The spinal needle was inserted toward the target using a "trajectory" view along the fluoroscope beam.  Under AP and lateral visualization, the needle was advanced so it did not puncture dura and was located close the 6 O'Clock position of the pedical in AP tracterory. Biplanar projections were used to confirm position. Aspiration was confirmed to be negative for CSF and/or blood. A 1-2 ml. volume of Isovue-250 was injected and flow of contrast was noted at each level. Radiographs were obtained for documentation purposes.   After attaining the desired flow of contrast documented above, a 0.5 to 1.0  ml test dose of 0.25% Marcaine was injected into each respective transforaminal space.  The patient was observed for 90 seconds post injection.  After no sensory deficits were reported, and normal lower extremity motor function was noted,   the above injectate was administered so that equal amounts of the injectate were placed at each foramen (level) into the transforaminal epidural space.   Additional Comments:  The patient tolerated the procedure well Dressing: Band-Aid    Post-procedure details: Patient was observed during the procedure. Post-procedure instructions were reviewed.  Patient left the clinic in stable condition.

## 2018-03-11 NOTE — Progress Notes (Signed)
[name] - 52 y.o. female MRN [id number]  Date of birth: 29-Aug-1966  Office Visit Note: Visit Date: 03/10/2018 PCP: Richardean Chimera, MD Referred by: Richardean Chimera, MD  Subjective: Chief Complaint  Patient presents with  . Lower Back - Pain   HPI: [name] is a 52 year old female with right radicular type pain with fairly normal-appearing lumbar spine MRI other than laminectomy defect from prior L5-S1 laminectomy.  By way of review she was worked in as an urgent work in by Dr. Ophelia Charter on 01/24/2018 for right L5 transforaminal epidural steroid injection.  She got almost instantaneous relief for a little while that day but did not have lasting benefit.  She ultimately saw Dr. Donalee Citrin who suggested right L4 transforaminal epidural steroid injection.  This was completed on 02/24/2018.  She reports lasting 60 to 70% relief.  She rates her pain down some level of 3 or 4 out of 10.  It does limit what she can do from a functional standpoint.  We are going to repeat the L4 injection.  She will follow-up with Dr. Wynetta Emery.   ROS Otherwise per HPI.  Assessment & Plan: Visit Diagnoses:  1. Lumbar radiculopathy     Plan: No additional findings.   Meds & Orders:  Meds ordered this encounter  Medications  . betamethasone acetate-betamethasone sodium phosphate (CELESTONE) injection 12 mg    Orders Placed This Encounter  Procedures  . XR C-ARM NO REPORT  . Epidural Steroid injection    Follow-up: Return if symptoms worsen or fail to improve, for Dr. Wynetta Emery.   Procedures: No procedures performed  Lumbosacral Transforaminal Epidural Steroid Injection - Sub-Pedicular Approach with Fluoroscopic Guidance  Patient: [name]      Date of Birth: Dec 18, 1965 MRN: [id number] PCP: Richardean Chimera, MD      Visit Date: 03/10/2018   Universal Protocol:    Date/Time: 03/10/2018  Consent Given By: the patient  Position: PRONE  Additional Comments: Vital signs were monitored before and after the  procedure. Patient was prepped and draped in the usual sterile fashion. The correct patient, procedure, and site was verified.   Injection Procedure Details:  Procedure Site One Meds Administered:  Meds ordered this encounter  Medications  . betamethasone acetate-betamethasone sodium phosphate (CELESTONE) injection 12 mg    Laterality: Right  Location/Site:  L4-L5  Needle size: 22 G  Needle type: Spinal  Needle Placement: Transforaminal  Findings:    -Comments: Excellent flow of contrast along the nerve and into the epidural space.  Procedure Details: After squaring off the end-plates to get a true AP view, the C-arm was positioned so that an oblique view of the foramen as noted above was visualized. The target area is just inferior to the "nose of the scotty dog" or sub pedicular. The soft tissues overlying this structure were infiltrated with 2-3 ml. of 1% Lidocaine without Epinephrine.  The spinal needle was inserted toward the target using a "trajectory" view along the fluoroscope beam.  Under AP and lateral visualization, the needle was advanced so it did not puncture dura and was located close the 6 O'Clock position of the pedical in AP tracterory. Biplanar projections were used to confirm position. Aspiration was confirmed to be negative for CSF and/or blood. A 1-2 ml. volume of Isovue-250 was injected and flow of contrast was noted at each level. Radiographs were obtained for documentation purposes.   After attaining the desired flow of contrast documented above, a 0.5 to 1.0 ml test dose of  0.25% Marcaine was injected into each respective transforaminal space.  The patient was observed for 90 seconds post injection.  After no sensory deficits were reported, and normal lower extremity motor function was noted,   the above injectate was administered so that equal amounts of the injectate were placed at each foramen (level) into the transforaminal epidural  space.   Additional Comments:  The patient tolerated the procedure well Dressing: Band-Aid    Post-procedure details: Patient was observed during the procedure. Post-procedure instructions were reviewed.  Patient left the clinic in stable condition.    Clinical History: MRI LUMBAR SPINE WITHOUT AND WITH CONTRAST  TECHNIQUE: Multiplanar and multiecho pulse sequences of the lumbar spine were obtained without and with intravenous contrast.  CONTRAST:  11mL MULTIHANCE GADOBENATE DIMEGLUMINE 529 MG/ML IV SOLN  COMPARISON:  Radiography 12/11/2005.  MRI 11/26/2005.  FINDINGS: Segmentation:  5 lumbar type vertebral bodies.  Alignment: Normal in the lumbar region. Lower thoracic curvature convex to the left of a mild degree is noted.  Vertebrae: No significant finding. Benign appearing hemangiomas most notable at T12. Chronic discogenic endplate changes at L5-S1 without edema or enhancement presently.  Conus medullaris and cauda equina: Conus extends to the upper L1 level. Conus and cauda equina appear normal.  Paraspinal and other soft tissues: Normal  Disc levels:  Mild non-compressive disc bulge at T11-12. No significant finding from T12-L1 through L3-4. No disc herniation or stenosis. No evidence of facet arthropathy.  L4-5: Minimal disc bulge. No stenosis or neural compression. No significant facet arthropathy.  L5-S1: Chronic disc degeneration. Previous left hemilaminectomy. Mild bulging of the disc. No unexpected epidural fibrosis. No canal or foraminal stenosis. Chronic endplate marrow changes without edema or enhancement.  IMPRESSION: Distant left hemilaminectomy and discectomy at L5-S1 without evidence of residual or recurrent disc herniation. Chronic discogenic endplate marrow changes at this level, but without edema or enhancement presently to suggest symptomatic disease. No stenosis presently.  Negative at L4-5 and above other than a  minimal disc bulge at L4-5.  The examination does not show an explanation of pain radiating to the right hip or right leg weakness and numbness.   Electronically Signed   By: Paulina FusiMark  Shogry M.D.   On: 01/23/2018 07:22   She reports that she has quit smoking. Her smoking use included cigarettes. She has never used smokeless tobacco. No results for input(s): HGBA1C, LABURIC in the last 8760 hours.  Objective:  VS:  HT:    WT:   BMI:     BP:102/63  HR:97bpm  TEMP: ( )  RESP:  Physical Exam  Ortho Exam Imaging: Xr C-arm No Report  Result Date: 03/10/2018 Please see Notes tab for imaging impression.   Past Medical/Family/Surgical/Social History: Medications & Allergies reviewed per EMR, new medications updated. Patient Active Problem List   Diagnosis Date Noted  . Acute right-sided low back pain with right-sided sciatica 02/03/2018  . 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 Hypertension    Family History  Problem Relation Age of Onset  . Heart disease Mother   . Cancer Father        sarcoma in small intestine per pt  . Colon cancer Neg Hx   . Esophageal cancer Neg Hx   . Rectal cancer Neg Hx   . Stomach cancer Neg Hx    Past Surgical History:  Procedure Laterality Date  . ANKLE SURGERY     January 2000  .  BACK SURGERY    . HAND RECONSTRUCTION     right   Social History   Occupational History  . Not on file  Tobacco Use  . Smoking status: Former Smoker    Types: Cigarettes  . Smokeless tobacco: Never Used  . Tobacco comment: more than 10 years ago  Substance and Sexual Activity  . Alcohol use: Not on file    Comment: occasional vodka and soda  . Drug use: No  . Sexual activity: Not on file

## 2018-03-18 ENCOUNTER — Other Ambulatory Visit: Payer: Self-pay | Admitting: Neurosurgery

## 2018-03-18 DIAGNOSIS — M5416 Radiculopathy, lumbar region: Secondary | ICD-10-CM

## 2018-03-30 ENCOUNTER — Ambulatory Visit
Admission: RE | Admit: 2018-03-30 | Discharge: 2018-03-30 | Disposition: A | Discharge status: Home / Self Care | Payer: 59 | Source: Ambulatory Visit | Attending: Neurosurgery | Admitting: Neurosurgery

## 2018-03-30 DIAGNOSIS — M5416 Radiculopathy, lumbar region: Secondary | ICD-10-CM

## 2018-03-30 MED ORDER — IOPAMIDOL (ISOVUE-M 200) INJECTION 41%
15.0000 mL | Freq: Once | INTRAMUSCULAR | Status: AC
  Administered 2018-03-30: 15 mL via INTRATHECAL

## 2018-03-30 NOTE — Progress Notes (Addendum)
Patient states she has been off Tramadol for at least the past two days.  She states Valium "doesn't work" on her, so she took PO Dilaudid prescribed by Dr. Wynetta Emeryram.

## 2018-03-30 NOTE — Discharge Instructions (Signed)
Myelogram Discharge Instructions  1. Go home and rest quietly for the next 24 hours.  It is important to lie flat for the next 24 hours.  Get up only to go to the restroom.  You may lie in the bed or on a couch on your back, your stomach, your left side or your right side.  You may have one pillow under your head.  You may have pillows between your knees while you are on your side or under your knees while you are on your back.  2. DO NOT drive today.  Recline the seat as far back as it will go, while still wearing your seat belt, on the way home.  3. You may get up to go to the bathroom as needed.  You may sit up for 10 minutes to eat.  You may resume your normal diet and medications unless otherwise indicated.  Drink plenty of extra fluids today and tomorrow.  4. The incidence of a spinal headache with nausea and/or vomiting is about 5% (one in 20 patients).  If you develop a headache, lie flat and drink plenty of fluids until the headache goes away.  Caffeinated beverages may be helpful.  If you develop severe nausea and vomiting or a headache that does not go away with flat bed rest, call [phone].  5. You may resume normal activities after your 24 hours of bed rest is over; however, do not exert yourself strongly or do any heavy lifting tomorrow.  6. Call your physician for a follow-up appointment.    You may resume Tramadol on Thursday, March 31, 2018 after 9:30a.m.

## 2018-04-05 ENCOUNTER — Ambulatory Visit
Admission: RE | Admit: 2018-04-05 | Discharge: 2018-04-05 | Disposition: A | Discharge status: Home / Self Care | Payer: 59 | Source: Ambulatory Visit | Attending: Neurosurgery | Admitting: Neurosurgery

## 2018-04-05 ENCOUNTER — Other Ambulatory Visit: Payer: Self-pay | Admitting: Neurosurgery

## 2018-04-05 DIAGNOSIS — G971 Other reaction to spinal and lumbar puncture: Secondary | ICD-10-CM

## 2018-04-05 MED ORDER — IOPAMIDOL (ISOVUE-M 200) INJECTION 41%
1.0000 mL | Freq: Once | INTRAMUSCULAR | Status: AC
  Administered 2018-04-05: 1 mL via EPIDURAL

## 2018-04-05 NOTE — Discharge Instructions (Signed)

## 2018-04-05 NOTE — Progress Notes (Signed)
20cc blood drawn from right AC space for epidural blood patch; site unremarkable. 

## 2018-04-08 ENCOUNTER — Other Ambulatory Visit: Payer: Self-pay | Admitting: Neurosurgery

## 2018-04-08 ENCOUNTER — Telehealth (INDEPENDENT_AMBULATORY_CARE_PROVIDER_SITE_OTHER): Payer: Self-pay | Admitting: *Deleted

## 2018-04-08 DIAGNOSIS — M5416 Radiculopathy, lumbar region: Secondary | ICD-10-CM

## 2018-04-08 NOTE — Telephone Encounter (Signed)
Per [employer] website, Notification/Prior Authorization not required for code [id number] if procedure performed in Office.

## 2018-04-12 ENCOUNTER — Encounter (INDEPENDENT_AMBULATORY_CARE_PROVIDER_SITE_OTHER): Payer: Self-pay | Admitting: Physical Medicine and Rehabilitation

## 2018-04-14 ENCOUNTER — Encounter (INDEPENDENT_AMBULATORY_CARE_PROVIDER_SITE_OTHER): Payer: Self-pay | Admitting: Physical Medicine and Rehabilitation

## 2018-04-22 ENCOUNTER — Ambulatory Visit
Admission: RE | Admit: 2018-04-22 | Discharge: 2018-04-22 | Disposition: A | Discharge status: Home / Self Care | Payer: 59 | Source: Ambulatory Visit | Attending: Neurosurgery | Admitting: Neurosurgery

## 2018-04-22 DIAGNOSIS — M5416 Radiculopathy, lumbar region: Secondary | ICD-10-CM

## 2018-04-22 MED ORDER — METHYLPREDNISOLONE ACETATE 40 MG/ML INJ SUSP (RADIOLOG
120.0000 mg | Freq: Once | INTRAMUSCULAR | Status: AC
  Administered 2018-04-22: 120 mg via EPIDURAL

## 2018-04-22 MED ORDER — IOPAMIDOL (ISOVUE-M 200) INJECTION 41%
1.0000 mL | Freq: Once | INTRAMUSCULAR | Status: AC
  Administered 2018-04-22: 1 mL via EPIDURAL

## 2018-04-22 NOTE — Discharge Instructions (Signed)

## 2018-04-25 ENCOUNTER — Encounter (INDEPENDENT_AMBULATORY_CARE_PROVIDER_SITE_OTHER): Payer: Self-pay | Admitting: Physical Medicine and Rehabilitation

## 2019-04-24 ENCOUNTER — Telehealth: Payer: Self-pay

## 2019-04-24 NOTE — Telephone Encounter (Signed)
Per Dr Lorin Mercy rx for custom inserts faxed to [facility]

## 2019-06-15 IMAGING — MR MR LUMBAR SPINE WO/W CM
7 series · 48 of 48 positions shown · IV contrast (multihance)
Comparison: Radiography 12/11/2005.  MRI 11/26/2005.

CLINICAL DATA: Low back pain radiating to the right hip. Weakness
and numbness in the legs.

Creatinine was obtained on site at [HOSPITAL] at [HOSPITAL].
Results: Creatinine 0.9 mg/dL.
EXAM:
MRI LUMBAR SPINE WITHOUT AND WITH CONTRAST
TECHNIQUE: Multiplanar and multiecho pulse sequences of the lumbar spine were
obtained without and with intravenous contrast.
CONTRAST:  11mL MULTIHANCE GADOBENATE DIMEGLUMINE 529 MG/ML IV SOLN

[Series 3: tirm sag · sagittal · 4.0mm · 0.55mm/px · 4 of 12 slices shown]
[im 1/12]
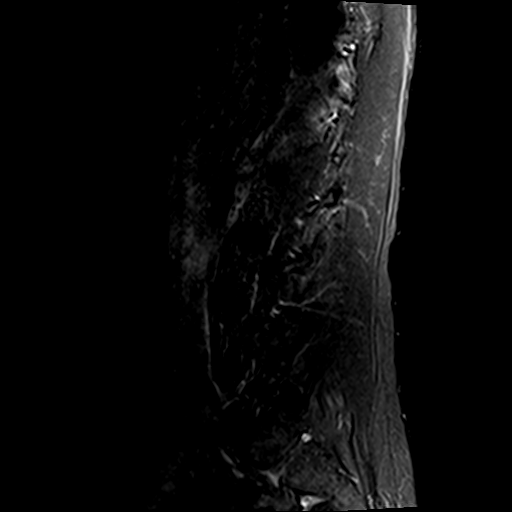
[im 4/12]
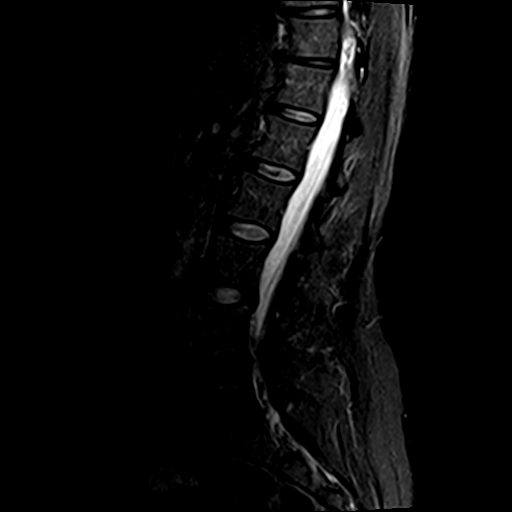
[im 8/12]
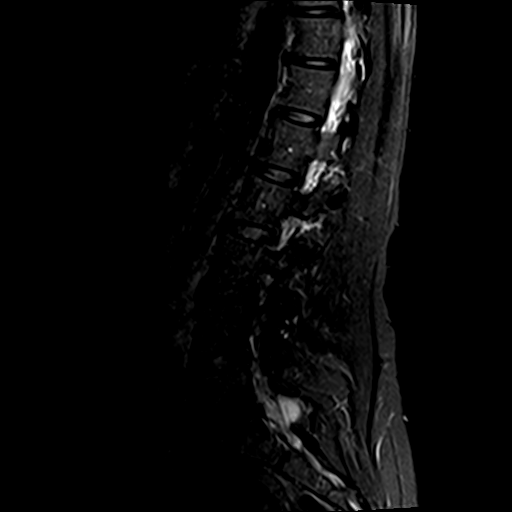
[im 12/12]
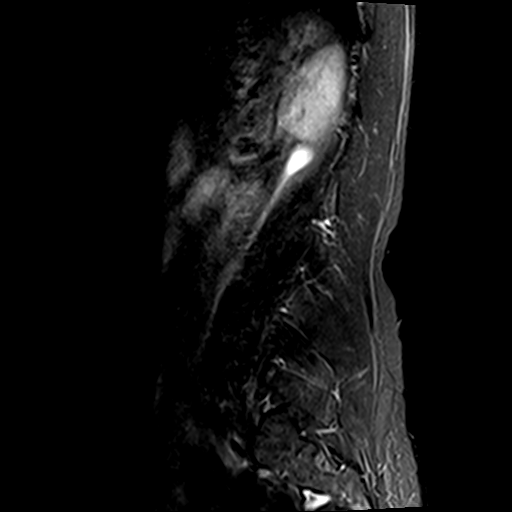

[Series 4: T1 · sagittal · 4.0mm · 0.88mm/px · 4 of 12 slices shown (1 of 2)]
[im 1/12]
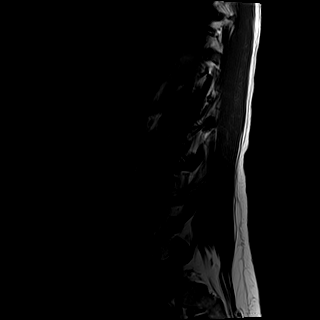
[im 4/12]
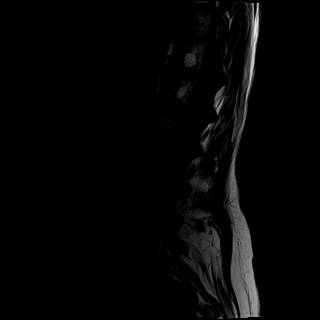
[im 8/12]
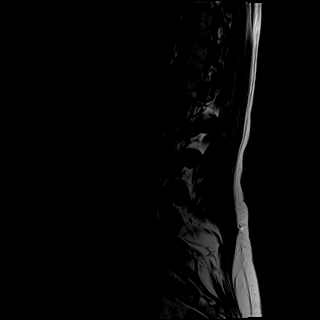
[im 12/12]
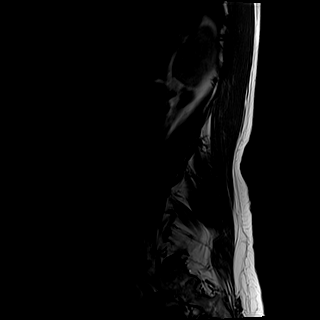

[Series 5: T1 · axial · 4.0mm · 0.78mm/px · z∈[-78,+130]mm · 6 of 18 slices shown (2 of 2)]
[im 1/18]
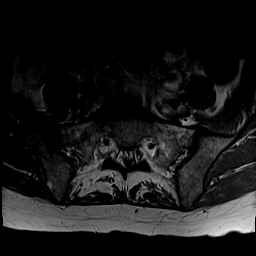
[im 4/18]
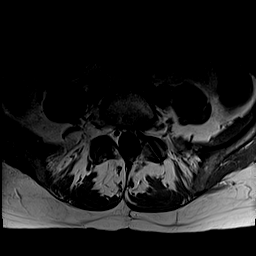
[im 7/18]
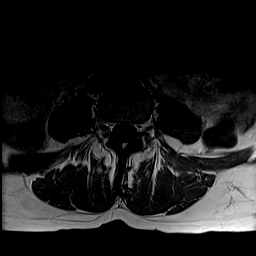
[im 11/18]
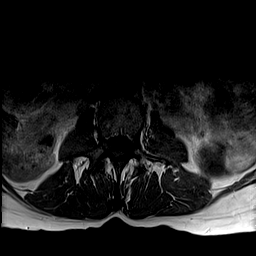
[im 14/18]
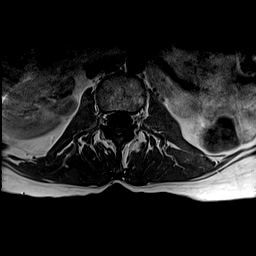
[im 18/18]
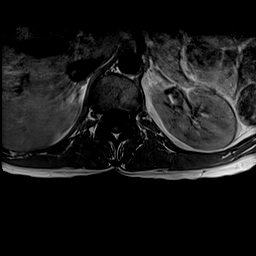

[Series 6: T2 · axial · 4.0mm · 0.78mm/px · z∈[-78,+139]mm · 13 of 36 slices shown]
[im 1/36]
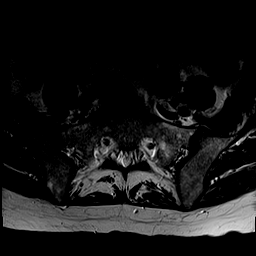
[im 3/36]
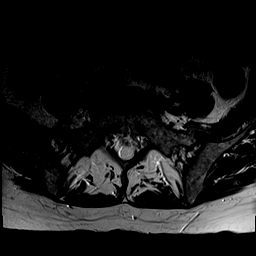
[im 6/36]
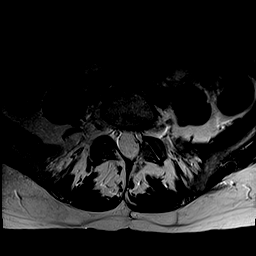
[im 9/36]
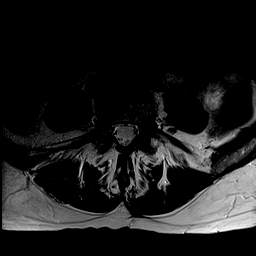
[im 12/36]
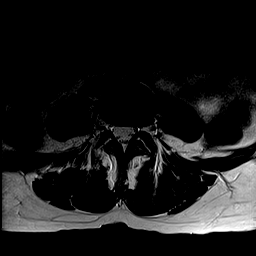
[im 15/36]
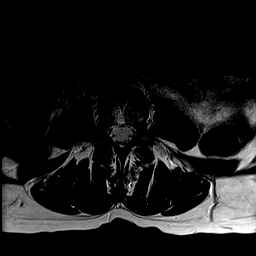
[im 18/36]
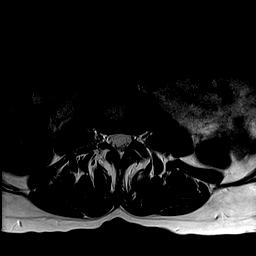
[im 21/36]
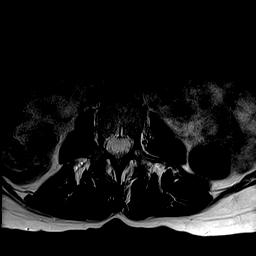
[im 24/36]
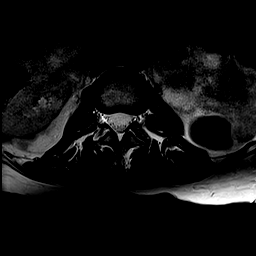
[im 27/36]
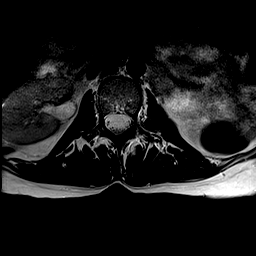
[im 30/36]
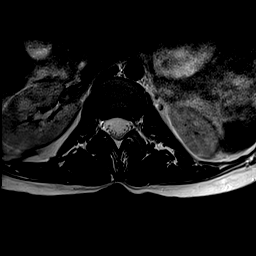
[im 33/36]
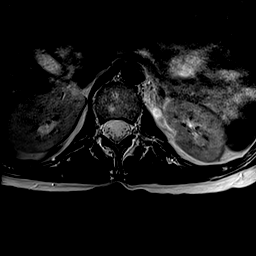
[im 36/36]
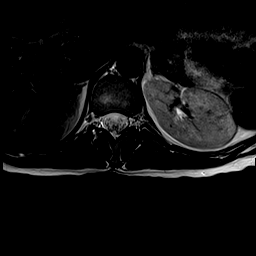

[Series 7: T2 post-contrast · sagittal · 4.0mm · 0.88mm/px · 4 of 12 slices shown]
[im 1/12]
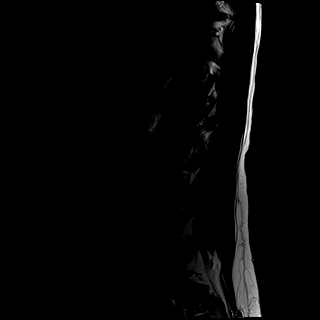
[im 4/12]
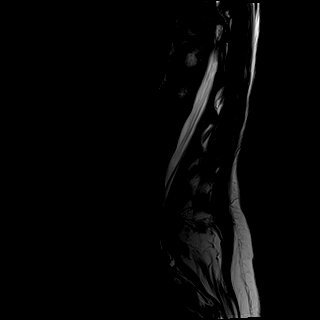
[im 8/12]
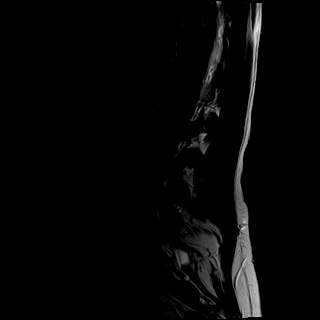
[im 12/12]
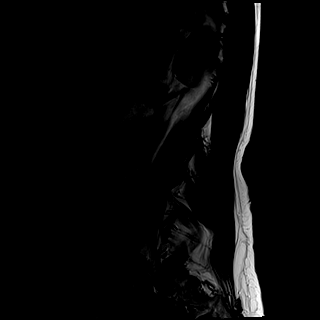

[Series 8: T1 fat-sat post-contrast · sagittal · 4.0mm · 0.88mm/px · 4 of 12 slices shown]
[im 1/12]
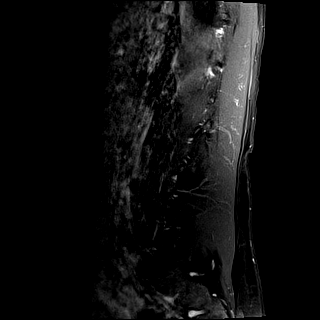
[im 4/12]
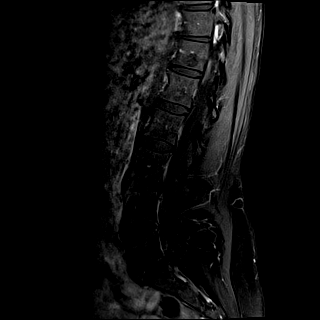
[im 8/12]
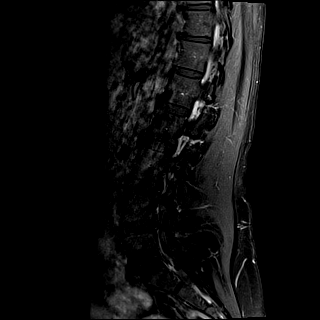
[im 12/12]
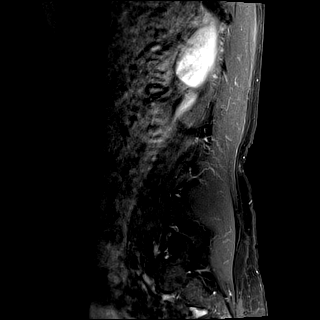

[Series 9: T1 post-contrast · axial · 4.0mm · 0.78mm/px · z∈[-78,+139]mm · 13 of 36 slices shown]
[im 1/36]
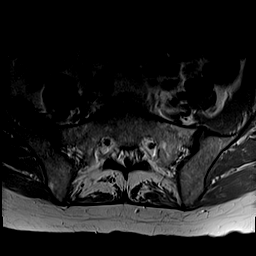
[im 3/36]
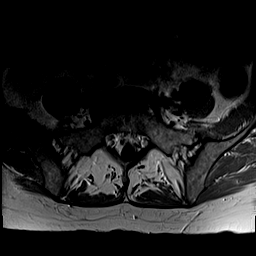
[im 6/36]
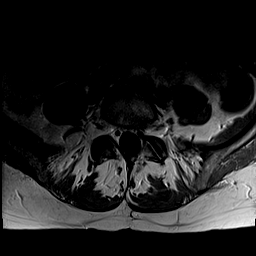
[im 9/36]
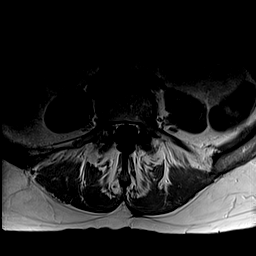
[im 12/36]
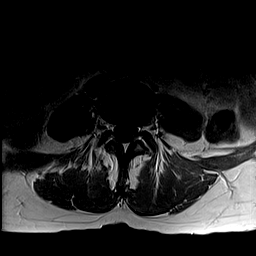
[im 15/36]
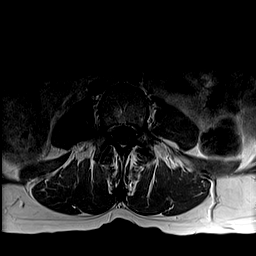
[im 18/36]
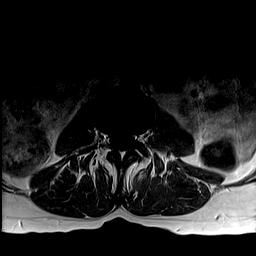
[im 21/36]
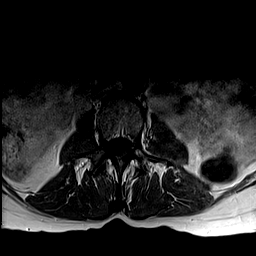
[im 24/36]
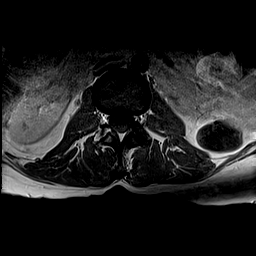
[im 27/36]
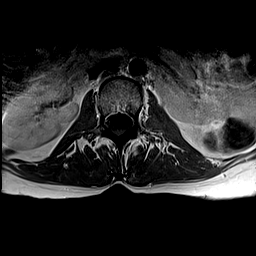
[im 30/36]
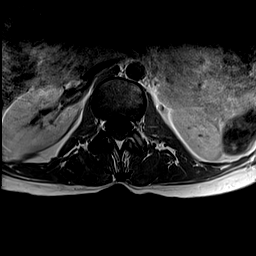
[im 33/36]
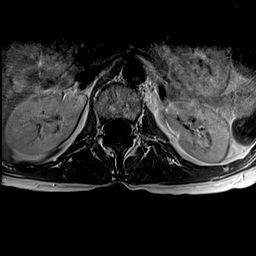
[im 36/36]
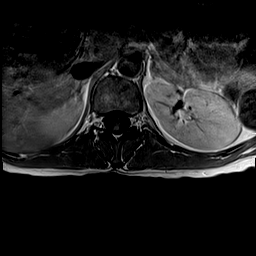

[48 of 48 positions shown; findings below may reference images not displayed]

FINDINGS: Segmentation:  5 lumbar type vertebral bodies.

Alignment: Normal in the lumbar region. Lower thoracic curvature
convex to the left of a mild degree is noted.

Vertebrae: No significant finding. Benign appearing hemangiomas most
notable at T12. Chronic discogenic endplate changes at L5-S1 without
edema or enhancement presently.

Conus medullaris and cauda equina: Conus extends to the upper L1
level. Conus and cauda equina appear normal.

Paraspinal and other soft tissues: Normal

Disc levels:

Mild non-compressive disc bulge at T11-12. No significant finding
from T12-L1 through L3-4. No disc herniation or stenosis. No
evidence of facet arthropathy.

L4-5: Minimal disc bulge. No stenosis or neural compression. No
significant facet arthropathy.

L5-S1: Chronic disc degeneration. Previous left hemilaminectomy.
Mild bulging of the disc. No unexpected epidural fibrosis. No canal
or foraminal stenosis. Chronic endplate marrow changes without edema
or enhancement.
IMPRESSION: Distant left hemilaminectomy and discectomy at L5-S1 without
evidence of residual or recurrent disc herniation. Chronic
discogenic endplate marrow changes at this level, but without edema
or enhancement presently to suggest symptomatic disease. No stenosis
presently.

Negative at L4-5 and above other than a minimal disc bulge at L4-5.

The examination does not show an explanation of pain radiating to
the right hip or right leg weakness and numbness.

## 2020-04-16 ENCOUNTER — Ambulatory Visit (INDEPENDENT_AMBULATORY_CARE_PROVIDER_SITE_OTHER): Payer: Commercial Managed Care - PPO | Admitting: Orthopaedic Surgery

## 2020-04-16 ENCOUNTER — Encounter: Payer: Self-pay | Admitting: Orthopaedic Surgery

## 2020-04-16 ENCOUNTER — Ambulatory Visit: Payer: Self-pay

## 2020-04-16 VITALS — BP 116/81 | HR 71 | Ht 66.5 in | Wt 134.0 lb

## 2020-04-16 DIAGNOSIS — M7042 Prepatellar bursitis, left knee: Secondary | ICD-10-CM | POA: Insufficient documentation

## 2020-04-16 DIAGNOSIS — M25562 Pain in left knee: Secondary | ICD-10-CM | POA: Diagnosis not present

## 2020-04-16 DIAGNOSIS — M25561 Pain in right knee: Secondary | ICD-10-CM | POA: Diagnosis not present

## 2020-04-16 DIAGNOSIS — M79671 Pain in right foot: Secondary | ICD-10-CM

## 2020-04-16 DIAGNOSIS — M722 Plantar fascial fibromatosis: Secondary | ICD-10-CM | POA: Diagnosis not present

## 2020-04-16 MED ORDER — LIDOCAINE HCL 1 % IJ SOLN
0.5000 mL | INTRAMUSCULAR | Status: AC | PRN
  Administered 2020-04-16: .5 mL

## 2020-04-16 NOTE — Progress Notes (Signed)
Office Visit Note   Patient: [name]           Date of Birth: 12-24-1965           MRN: [id number] Visit Date: 04/16/2020              Requested by: Richardean Chimera, MD [address] [place],  [place] [address] PCP: Richardean Chimera, MD   Assessment & Plan: Visit Diagnoses:  1. Acute pain of left knee   2. Pain of right heel   3. Prepatellar bursitis of left knee   4. Plantar fasciitis, right     Plan: Aspirated 40cc blood from the patella bursa which extended laterally and proximally to the patella.  This decompressed the area.  Compressive wrap applied she will let us know if it reaccumulates.  We reviewed the x-rays no definite fractures present calcaneus.  Visco heel inserts given she can use in regular tennis shoe she will gradually progress with activities and I discussed with her she should not instantly resume walking 3 to 5 miles a day which is what she is usually done.  She will dissipate in hot yoga.  She will call if she is having persistent problems.  Follow-Up Instructions: No follow-ups on file.   Orders:  Orders Placed This Encounter  Procedures  . XR KNEE 3 VIEW LEFT  . XR Os Calcis Right   No orders of the defined types were placed in this encounter.     Procedures: Large Joint Inj: L knee on 04/16/2020 12:22 PM Indications: joint swelling and pain Details: 22 G 1.5 in needle, lateral approach  Arthrogram: No  Medications: 0.5 mL lidocaine 1 % Aspirate: 40 mL bloody Outcome: tolerated well, no immediate complications Procedure, treatment alternatives, risks and benefits explained, specific risks discussed. Consent was given by the patient. Immediately prior to procedure a time out was called to verify the correct patient, procedure, equipment, support staff and site/side marked as required. Patient was prepped and draped in the usual sterile fashion.       Clinical Data: No additional findings.   Subjective: Chief Complaint  Patient  presents with  . Left Knee - Pain, Edema    HPI 54 year old female seen with left knee prepatellar bursal fluid accumulation that occurred after she bumped her knee while in [place] 5 weeks ago.  She had it drained July 25 and had it reaccumulate.  Second problem she has had is pain in her right heel.  She states she was seen by podiatrist who told her she had a calcaneus fracture.  She has had symptoms for 5 months exactly over the plantar fascial origin.  She has a copy of one image on her phone.  New x-rays of her heel were obtained today.  Patient normally walks 3+ miles daily and has only been able to walk some days half a mile other days the following week will 1.5 miles.  She is placed in a short cam boot by the podiatrist she saw.  She has reaccumulation of prepatellar bursa primarily lateral on the left knee.  Review of Systems previous lumbar microdiscectomy at L5-S1.   Objective: Vital Signs: BP 116/81 (BP Location: Left Arm, Patient Position: Sitting)   Pulse 71   Ht 5' 6.5" (1.689 m)   Wt 134 lb (60.8 kg)   BMI 21.30 kg/m   Physical Exam Constitutional:      Appearance: She is well-developed.  HENT:     Head: Normocephalic.  Right Ear: External ear normal.     Left Ear: External ear normal.  Eyes:     Pupils: Pupils are equal, round, and reactive to light.  Neck:     Thyroid: No thyromegaly.     Trachea: No tracheal deviation.  Cardiovascular:     Rate and Rhythm: Normal rate.  Pulmonary:     Effort: Pulmonary effort is normal.  Abdominal:     Palpations: Abdomen is soft.  Skin:    General: Skin is warm and dry.  Neurological:     Mental Status: She is alert and oriented to person, place, and time.  Psychiatric:        Behavior: Behavior normal.     Ortho Exam patient has tenderness over the right plantar fascial origin.  Achilles tendon is normal subtalar motion is normal.  No ecchymosis present.  No heel cord contracture.  Negative popliteal compression  test.  Left knee shows prepatellar bursa that extends over the lateral retinaculum between the lateral femoral condyle and lateral aspect of the patella.  No cellulitis.  She does not have a true knee effusion.  Collateral ligaments ACL PCL is normal.  Specialty Comments:  No specialty comments available.  Imaging: No results found.   PMFS History: Patient Active Problem List   Diagnosis Date Noted  . Prepatellar bursitis of left knee 04/16/2020  . Plantar fasciitis, right 04/16/2020  . Acute right-sided low back pain with right-sided sciatica 02/03/2018  . 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 Hypertension     Family History  Problem Relation Age of Onset  . Heart disease Mother   . Cancer Father        sarcoma in small intestine per pt  . Colon cancer Neg Hx   . Esophageal cancer Neg Hx   . Rectal cancer Neg Hx   . Stomach cancer Neg Hx     Past Surgical History:  Procedure Laterality Date  . ANKLE SURGERY     January 2000  . BACK SURGERY    . HAND RECONSTRUCTION     right   Social History   Occupational History  . Not on file  Tobacco Use  . Smoking status: Former Smoker    Types: Cigarettes  . Smokeless tobacco: Never Used  . Tobacco comment: more than 10 years ago  Substance and Sexual Activity  . Alcohol use: Not on file    Comment: occasional vodka and soda  . Drug use: No  . Sexual activity: Not on file

## 2020-04-30 ENCOUNTER — Ambulatory Visit (INDEPENDENT_AMBULATORY_CARE_PROVIDER_SITE_OTHER): Payer: Commercial Managed Care - PPO | Admitting: Orthopaedic Surgery

## 2020-04-30 ENCOUNTER — Encounter: Payer: Self-pay | Admitting: Orthopaedic Surgery

## 2020-04-30 VITALS — BP 112/71 | HR 64 | Ht 66.5 in | Wt 134.0 lb

## 2020-04-30 DIAGNOSIS — M7042 Prepatellar bursitis, left knee: Secondary | ICD-10-CM

## 2020-04-30 MED ORDER — LIDOCAINE HCL 1 % IJ SOLN
0.5000 mL | INTRAMUSCULAR | Status: AC | PRN
  Administered 2020-04-30: .5 mL

## 2020-04-30 NOTE — Progress Notes (Signed)
Office Visit Note   Patient: [name]           Date of Birth: [birth year]           MRN: [id number] Visit Date: 04/30/2020              Requested by: Richardean Chimera, MD [address] [place],  [place] [address] PCP: Richardean Chimera, MD   Assessment & Plan: Visit Diagnoses: No diagnosis found.  Plan: 20cc serosanguineous fluid aspirated.  Lighter color than previous aspiration right was darker blood.  She will apply her Ace wrap keep some compression.  She will let us know if she has reaccumulation.  Follow-Up Instructions: No follow-ups on file.   Orders:  No orders of the defined types were placed in this encounter.  No orders of the defined types were placed in this encounter.     Procedures: Large Joint Inj: L knee on 04/30/2020 11:02 AM Indications: joint swelling and pain Details: 22 G 1.5 in needle, anterolateral approach  Arthrogram: No  Medications: 0.5 mL lidocaine 1 % Aspirate: 20 mL blood-tinged and serous Outcome: tolerated well, no immediate complications Procedure, treatment alternatives, risks and benefits explained, specific risks discussed. Consent was given by the patient. Immediately prior to procedure a time out was called to verify the correct patient, procedure, equipment, support staff and site/side marked as required. Patient was prepped and draped in the usual sterile fashion.       Clinical Data: No additional findings.   Subjective: Chief Complaint  Patient presents with  . Left Knee - Pain    HPI 54 year old female returns with reaccumulation of prepatellar bursal fluid which is over the lateral retinaculum.  She is requesting repeat aspiration of the subcutaneous pocket of her left knee.  No fever or chills.  Fluid is posttraumatic.  Review of Systems unchanged from my previous office visit.   Objective: Vital Signs: BP 112/71   Pulse 64   Ht 5' 6.5" (1.689 m)   Wt 134 lb (60.8 kg)   BMI 21.30 kg/m   Physical  Exam Constitutional:      Appearance: She is well-developed.  HENT:     Head: Normocephalic.     Right Ear: External ear normal.     Left Ear: External ear normal.  Eyes:     Pupils: Pupils are equal, round, and reactive to light.  Neck:     Thyroid: No thyromegaly.     Trachea: No tracheal deviation.  Cardiovascular:     Rate and Rhythm: Normal rate.  Pulmonary:     Effort: Pulmonary effort is normal.  Abdominal:     Palpations: Abdomen is soft.  Skin:    General: Skin is warm and dry.  Neurological:     Mental Status: She is alert and oriented to person, place, and time.  Psychiatric:        Behavior: Behavior normal.     Ortho Exam reaccumulation of fluid over the prepatellar bursa and over the lateral retinaculum easily ballotable.  Normal hip range of motion.  Specialty Comments:  No specialty comments available.  Imaging: No results found.   PMFS History: Patient Active Problem List   Diagnosis Date Noted  . Prepatellar bursitis of left knee 04/16/2020  . Plantar fasciitis, right 04/16/2020  . Acute right-sided low back pain with right-sided sciatica 02/03/2018  . 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 Hypertension  Family History  Problem Relation Age of Onset  . Heart disease Mother   . Cancer Father        sarcoma in small intestine per pt  . Colon cancer Neg Hx   . Esophageal cancer Neg Hx   . Rectal cancer Neg Hx   . Stomach cancer Neg Hx     Past Surgical History:  Procedure Laterality Date  . ANKLE SURGERY     January 2000  . BACK SURGERY    . HAND RECONSTRUCTION     right   Social History   Occupational History  . Not on file  Tobacco Use  . Smoking status: Former Smoker    Types: Cigarettes  . Smokeless tobacco: Never Used  . Tobacco comment: more than 10 years ago  Substance and Sexual Activity  . Alcohol use: Not on file    Comment: occasional vodka and soda  . Drug use:  No  . Sexual activity: Not on file

## 2020-07-31 DIAGNOSIS — S96911A Strain of unspecified muscle and tendon at ankle and foot level, right foot, initial encounter: Secondary | ICD-10-CM | POA: Diagnosis not present

## 2020-07-31 DIAGNOSIS — M79671 Pain in right foot: Secondary | ICD-10-CM | POA: Diagnosis not present

## 2020-07-31 DIAGNOSIS — M25571 Pain in right ankle and joints of right foot: Secondary | ICD-10-CM | POA: Diagnosis not present

## 2020-07-31 DIAGNOSIS — M722 Plantar fascial fibromatosis: Secondary | ICD-10-CM | POA: Diagnosis not present

## 2020-07-31 DIAGNOSIS — M25572 Pain in left ankle and joints of left foot: Secondary | ICD-10-CM | POA: Diagnosis not present

## 2020-07-31 DIAGNOSIS — M79672 Pain in left foot: Secondary | ICD-10-CM | POA: Diagnosis not present

## 2020-08-02 DIAGNOSIS — M722 Plantar fascial fibromatosis: Secondary | ICD-10-CM | POA: Diagnosis not present

## 2020-08-02 DIAGNOSIS — M25572 Pain in left ankle and joints of left foot: Secondary | ICD-10-CM | POA: Diagnosis not present

## 2020-08-05 DIAGNOSIS — Z6823 Body mass index (BMI) 23.0-23.9, adult: Secondary | ICD-10-CM | POA: Diagnosis not present

## 2020-08-05 DIAGNOSIS — Z1231 Encounter for screening mammogram for malignant neoplasm of breast: Secondary | ICD-10-CM | POA: Diagnosis not present

## 2020-08-05 DIAGNOSIS — Z01419 Encounter for gynecological examination (general) (routine) without abnormal findings: Secondary | ICD-10-CM | POA: Diagnosis not present

## 2020-08-30 ENCOUNTER — Other Ambulatory Visit: Payer: Self-pay

## 2020-08-30 ENCOUNTER — Encounter: Payer: Self-pay | Admitting: Orthopaedic Surgery

## 2020-08-30 ENCOUNTER — Ambulatory Visit (INDEPENDENT_AMBULATORY_CARE_PROVIDER_SITE_OTHER): Payer: BC Managed Care – PPO | Admitting: Orthopaedic Surgery

## 2020-08-30 DIAGNOSIS — M76822 Posterior tibial tendinitis, left leg: Secondary | ICD-10-CM | POA: Diagnosis not present

## 2020-09-02 DIAGNOSIS — M76822 Posterior tibial tendinitis, left leg: Secondary | ICD-10-CM | POA: Insufficient documentation

## 2020-09-02 DIAGNOSIS — Z20822 Contact with and (suspected) exposure to covid-19: Secondary | ICD-10-CM | POA: Diagnosis not present

## 2020-09-02 NOTE — Progress Notes (Signed)
Office Visit Note   Patient: [name]           Date of Birth: 06-04-1966           MRN: [id number] Visit Date: 08/30/2020              Requested by: Richardean Chimera, MD [address] [place],  [place] [address] PCP: Richardean Chimera, MD   Assessment & Plan: Visit Diagnoses:  1. Posterior tibial tendinitis, left leg     Plan: We discussed making sure she is wearing shoes and have high arch buildup with good support to help unload the posterior tibial tendon.  She has a lace up Swede-O back at her house she could use to help additionally unload it.  Continue anti-inflammatories intermittent ice.  I do not think she needs MRI scan which should be able to walk with her lace up ankle brace on approximately half her normal distance and then gradually ramp back up to full distance over a few weeks.  We discussed importance of not trying to go quite as fast the speed which is normally her preference when she walks.  Pathophysiology of posterior tibial tendon problems discussed in detail.  She has persistent problems we can consider MRI imaging.  I think the Swede-O and anti-inflammatories should take care of the problem and she can follow-up with me as needed.  Follow-Up Instructions: No follow-ups on file.   Orders:  No orders of the defined types were placed in this encounter.  No orders of the defined types were placed in this encounter.     Procedures: No procedures performed   Clinical Data: No additional findings.   Subjective: Chief Complaint  Patient presents with  . Left Ankle - Pain    HPI 54 year old female seen with a left ankle pain.  She had been walking her normal 3 to 5 miles daily and started having some increased discomfort she is wearing a cam boot and was stopped in the [place] airport by a foot specialist who talked with her and stated that she needed surgery to fix her left ankle.  She previously had had surgery by me for recurrent ankle sprains of  the right ankle with anterolateral reconstruction and bunion procedure which did well.  She is now here to discuss surgery on her left ankle.  She is complained of pain medially just posterior to the deltoid ligament that radiates proximal just above the ankle joint.  She has been able to walk a mile and a half but cannot walk 3 miles.  She is used ibuprofen 800 mg with some relief.  She has custom inserts for past problems with plantar fasciitis.  Review of Systems proves lumbar microdiscectomy by me in the pastleft L5-S1.  All other systems are negative.   Objective: Vital Signs: BP 108/73   Pulse 67   Ht 5\' 6"  (1.676 m)   Wt 136 lb (61.7 kg)   BMI 21.95 kg/m   Physical Exam Constitutional:      Appearance: She is well-developed.  HENT:     Head: Normocephalic.     Right Ear: External ear normal.     Left Ear: External ear normal.  Eyes:     Pupils: Pupils are equal, round, and reactive to light.  Neck:     Thyroid: No thyromegaly.     Trachea: No tracheal deviation.  Cardiovascular:     Rate and Rhythm: Normal rate.  Pulmonary:  Effort: Pulmonary effort is normal.  Abdominal:     Palpations: Abdomen is soft.  Skin:    General: Skin is warm and dry.  Neurological:     Mental Status: She is alert and oriented to person, place, and time.  Psychiatric:        Mood and Affect: Mood and affect normal.        Behavior: Behavior normal.     Ortho Exam patient has 1+ anterior drawer left ankle.  No tenderness anterolaterally.  Normal arch.  Minimal tenderness in the plantar fascial origin.  She is tender over the posterior tibial tendon just posterior to the medial malleolus that extends proximally over the tendon up to the musculotendinous junction on the left.  No tenderness on the right.  Specialty Comments:  No specialty comments available.  Imaging: No results found.   PMFS History: Patient Active Problem List   Diagnosis Date Noted  . Posterior tibial  tendinitis, left leg 09/02/2020  . Prepatellar bursitis of left knee 04/16/2020  . Plantar fasciitis, right 04/16/2020  . Acute right-sided low back pain with right-sided sciatica 02/03/2018  . 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 Hypertension     Family History  Problem Relation Age of Onset  . Heart disease Mother   . Cancer Father        sarcoma in small intestine per pt  . Colon cancer Neg Hx   . Esophageal cancer Neg Hx   . Rectal cancer Neg Hx   . Stomach cancer Neg Hx     Past Surgical History:  Procedure Laterality Date  . ANKLE SURGERY     January 2000  . BACK SURGERY    . HAND RECONSTRUCTION     right   Social History   Occupational History  . Not on file  Tobacco Use  . Smoking status: Former Smoker    Types: Cigarettes  . Smokeless tobacco: Never Used  . Tobacco comment: more than 10 years ago  Substance and Sexual Activity  . Alcohol use: Not on file    Comment: occasional vodka and soda  . Drug use: No  . Sexual activity: Not on file

## 2020-11-19 DIAGNOSIS — D2261 Melanocytic nevi of right upper limb, including shoulder: Secondary | ICD-10-CM | POA: Diagnosis not present

## 2020-11-19 DIAGNOSIS — L570 Actinic keratosis: Secondary | ICD-10-CM | POA: Diagnosis not present

## 2020-11-19 DIAGNOSIS — L814 Other melanin hyperpigmentation: Secondary | ICD-10-CM | POA: Diagnosis not present

## 2020-11-19 DIAGNOSIS — D2262 Melanocytic nevi of left upper limb, including shoulder: Secondary | ICD-10-CM | POA: Diagnosis not present

## 2021-04-24 ENCOUNTER — Ambulatory Visit: Payer: Self-pay

## 2021-04-24 ENCOUNTER — Encounter: Payer: Self-pay | Admitting: Surgery

## 2021-04-24 ENCOUNTER — Other Ambulatory Visit: Payer: Self-pay

## 2021-04-24 ENCOUNTER — Ambulatory Visit: Payer: BC Managed Care – PPO | Admitting: Surgery

## 2021-04-24 VITALS — BP 115/77 | HR 75

## 2021-04-24 DIAGNOSIS — M7021 Olecranon bursitis, right elbow: Secondary | ICD-10-CM | POA: Diagnosis not present

## 2021-04-24 DIAGNOSIS — M25521 Pain in right elbow: Secondary | ICD-10-CM

## 2021-04-24 NOTE — Progress Notes (Signed)
Office Visit Note   Patient: [name]           Date of Birth: 30-Mar-1966           MRN: [id number] Visit Date: 04/24/2021              Requested by: Whitney Chimera, MD [address] [place],  [place] [address] PCP: Whitney Chimera, MD   Assessment & Plan: Visit Diagnoses:  1. Pain in right elbow   2. Olecranon bursitis, right elbow     Plan: With patient's acute right olecranon bursal swelling I did elect to try conservative treatment with aspiration and injection.  Patient consent right posterior elbow was prepped with Betadine and after using 1 cc 1% Xylocaine for local anesthetic I aspirated about 20 cc of serosanguineous fluid performed Marcaine/kenalog 2:1.  Patient procedure well and complication.  Compression dressing applied.  She will wear this x48 hours.  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  I advised him of patient's visit today.  She will follow-up with him in 1 week for recheck.  Follow-Up Instructions: Return in about 1 week (around 05/01/2021) for with dr yates for recheck elbow.   Orders:  Orders Placed This Encounter  Procedures   XR Elbow Complete Right (3+View)   No orders of the defined types were placed in this encounter.     Procedures: Medium Joint Inj: R olecranon bursa on 04/24/2021 3:27 PM Details: 18 G 1.5 in needle, posterior approach Medications: 1 mL lidocaine 1 %; 2 mL bupivacaine 0.25 % Aspirate: 20 mL (Serosanguineous) Outcome: tolerated well, no immediate complications  Marcaine/Kenalog 2 to 1 injection performed into the right olecranon bursa Consent was given by the patient. Patient was prepped and draped in the usual sterile fashion.      Clinical Data: No additional findings.   Subjective: Chief Complaint  Patient presents with   Right Elbow - Follow-up    HPI 55 year old white female comes in today with complaints of right posterior elbow swelling.  Patient states that problems been ongoing for couple  weeks.  States that she may have bumped her elbow when she was on her boat couple weeks ago.  She has had problems with right olecranon bursitis about 8 to 10 years ago and states that Dr. Ophelia Waller had previously performed an aspiration on her.  Patient called the office stating that she needed her elbow aspirated.  Not currently complaining of any pain.  Swelling has progressively gotten worse.  No complaints of fever chills.  Review of Systems No Current cardiac pulmonary GI GU issues  Objective: Vital Signs: BP 115/77 (BP Location: Left Arm, Patient Position: Sitting)   Pulse 75   SpO2 96%   Physical Exam HENT:     Head: Normocephalic.  Eyes:     Extraocular Movements: Extraocular movements intact.  Pulmonary:     Effort: No respiratory distress.  Musculoskeletal:     Comments: Pleasant female alert and oriented in no acute distress.  Right elbow she has good range of motion.  Olecranon bursal swelling about a golf ball size.  This area is nontender.  No signs of infection.  Neurological:     Mental Status: She is alert and oriented to person, place, and time.  Psychiatric:        Mood and Affect: Mood normal.    Ortho Exam  Specialty Comments:  No specialty comments available.  Imaging: No results found.   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    Past Medical History:  Diagnosis Date   Hypertension     Family History  Problem Relation Age of Onset   Heart disease Mother    Cancer Father        sarcoma in small intestine per pt   Colon cancer Neg Hx    Esophageal cancer Neg Hx    Rectal cancer Neg Hx    Stomach cancer Neg Hx     Past Surgical History:  Procedure Laterality Date    ANKLE SURGERY     January 2000   BACK SURGERY     HAND RECONSTRUCTION     right   Social History   Occupational History   Not on file  Tobacco Use   Smoking status: Former    Types: Cigarettes   Smokeless tobacco: Never   Tobacco comments:    more than 10 years ago  Substance and Sexual Activity   Alcohol use: Not on file    Comment: occasional vodka and soda   Drug use: No   Sexual activity: Not on file

## 2021-04-29 MED ORDER — BUPIVACAINE HCL 0.25 % IJ SOLN
2.0000 mL | INTRAMUSCULAR | Status: AC | PRN
  Administered 2021-04-24: 2 mL via INTRA_ARTICULAR

## 2021-04-29 MED ORDER — LIDOCAINE HCL 1 % IJ SOLN
1.0000 mL | INTRAMUSCULAR | Status: AC | PRN
  Administered 2021-04-24: 1 mL

## 2021-08-18 DIAGNOSIS — Z13228 Encounter for screening for other metabolic disorders: Secondary | ICD-10-CM | POA: Diagnosis not present

## 2021-08-18 DIAGNOSIS — Z1329 Encounter for screening for other suspected endocrine disorder: Secondary | ICD-10-CM | POA: Diagnosis not present

## 2021-08-18 DIAGNOSIS — Z6821 Body mass index (BMI) 21.0-21.9, adult: Secondary | ICD-10-CM | POA: Diagnosis not present

## 2021-08-18 DIAGNOSIS — Z13220 Encounter for screening for lipoid disorders: Secondary | ICD-10-CM | POA: Diagnosis not present

## 2021-08-18 DIAGNOSIS — Z1321 Encounter for screening for nutritional disorder: Secondary | ICD-10-CM | POA: Diagnosis not present

## 2021-08-18 DIAGNOSIS — Z01419 Encounter for gynecological examination (general) (routine) without abnormal findings: Secondary | ICD-10-CM | POA: Diagnosis not present

## 2021-08-18 DIAGNOSIS — Z1231 Encounter for screening mammogram for malignant neoplasm of breast: Secondary | ICD-10-CM | POA: Diagnosis not present

## 2021-08-19 ENCOUNTER — Other Ambulatory Visit: Payer: Self-pay | Admitting: Obstetrics and Gynecology

## 2021-08-19 DIAGNOSIS — F17210 Nicotine dependence, cigarettes, uncomplicated: Secondary | ICD-10-CM

## 2021-09-17 DIAGNOSIS — E559 Vitamin D deficiency, unspecified: Secondary | ICD-10-CM | POA: Diagnosis not present

## 2021-09-22 ENCOUNTER — Ambulatory Visit
Admission: RE | Admit: 2021-09-22 | Discharge: 2021-09-22 | Disposition: A | Discharge status: Home / Self Care | Payer: BC Managed Care – PPO | Source: Ambulatory Visit | Attending: Obstetrics and Gynecology | Admitting: Obstetrics and Gynecology

## 2021-09-22 DIAGNOSIS — F17210 Nicotine dependence, cigarettes, uncomplicated: Secondary | ICD-10-CM

## 2021-10-02 DIAGNOSIS — F418 Other specified anxiety disorders: Secondary | ICD-10-CM | POA: Diagnosis not present

## 2021-10-02 DIAGNOSIS — Z79890 Hormone replacement therapy (postmenopausal): Secondary | ICD-10-CM | POA: Diagnosis not present

## 2021-10-02 DIAGNOSIS — M545 Low back pain, unspecified: Secondary | ICD-10-CM | POA: Diagnosis not present

## 2021-10-02 DIAGNOSIS — I10 Essential (primary) hypertension: Secondary | ICD-10-CM | POA: Diagnosis not present

## 2021-12-24 ENCOUNTER — Encounter: Payer: 59 | Admitting: Radiology

## 2021-12-27 ENCOUNTER — Encounter (HOSPITAL_COMMUNITY): Payer: Self-pay | Admitting: Emergency Medicine

## 2021-12-27 ENCOUNTER — Other Ambulatory Visit: Payer: Self-pay

## 2021-12-27 ENCOUNTER — Emergency Department (HOSPITAL_COMMUNITY)
Admission: EM | Admit: 2021-12-27 | Discharge: 2021-12-27 | Discharge status: Against Medical Advice | Payer: BC Managed Care – PPO | Attending: Emergency Medicine | Admitting: Emergency Medicine

## 2021-12-27 DIAGNOSIS — R519 Headache, unspecified: Secondary | ICD-10-CM | POA: Insufficient documentation

## 2021-12-27 DIAGNOSIS — R109 Unspecified abdominal pain: Secondary | ICD-10-CM | POA: Insufficient documentation

## 2021-12-27 DIAGNOSIS — R202 Paresthesia of skin: Secondary | ICD-10-CM | POA: Insufficient documentation

## 2021-12-27 DIAGNOSIS — R1084 Generalized abdominal pain: Secondary | ICD-10-CM | POA: Diagnosis not present

## 2021-12-27 DIAGNOSIS — R079 Chest pain, unspecified: Secondary | ICD-10-CM | POA: Diagnosis not present

## 2021-12-27 DIAGNOSIS — Z5321 Procedure and treatment not carried out due to patient leaving prior to being seen by health care provider: Secondary | ICD-10-CM | POA: Insufficient documentation

## 2021-12-27 DIAGNOSIS — R0789 Other chest pain: Secondary | ICD-10-CM | POA: Diagnosis not present

## 2021-12-27 LAB — COMPREHENSIVE METABOLIC PANEL WITH GFR
ALT: 21 U/L (ref 0–44)
AST: 42 U/L — ABNORMAL HIGH (ref 15–41)
Albumin: 4.5 g/dL (ref 3.5–5.0)
Alkaline Phosphatase: 48 U/L (ref 38–126)
Anion gap: 12 (ref 5–15)
BUN: 10 mg/dL (ref 6–20)
CO2: 25 mmol/L (ref 22–32)
Calcium: 9.8 mg/dL (ref 8.9–10.3)
Chloride: 87 mmol/L — ABNORMAL LOW (ref 98–111)
Creatinine, Ser: 0.86 mg/dL (ref 0.44–1.00)
GFR, Estimated: >60 mL/min
Glucose, Bld: 90 mg/dL (ref 70–99)
Potassium: 3.3 mmol/L — ABNORMAL LOW (ref 3.5–5.1)
Sodium: 124 mmol/L — ABNORMAL LOW (ref 135–145)
Total Bilirubin: 0.7 mg/dL (ref 0.3–1.2)
Total Protein: 6.9 g/dL (ref 6.5–8.1)

## 2021-12-27 LAB — CBC WITH DIFFERENTIAL/PLATELET
Abs Immature Granulocytes: 0.01 10*3/uL (ref 0.00–0.07)
Basophils Absolute: 0 10*3/uL (ref 0.0–0.1)
Basophils Relative: 1 %
Eosinophils Absolute: 0.1 10*3/uL (ref 0.0–0.5)
Eosinophils Relative: 1 %
HCT: 36.3 % (ref 36.0–46.0)
Hemoglobin: 13.4 g/dL (ref 12.0–15.0)
Immature Granulocytes: 0 %
Lymphocytes Relative: 26 %
Lymphs Abs: 1.7 10*3/uL (ref 0.7–4.0)
MCH: 34.1 pg — ABNORMAL HIGH (ref 26.0–34.0)
MCHC: 36.9 g/dL — ABNORMAL HIGH (ref 30.0–36.0)
MCV: 92.4 fL (ref 80.0–100.0)
Monocytes Absolute: 0.9 10*3/uL (ref 0.1–1.0)
Monocytes Relative: 13 %
Neutro Abs: 3.9 10*3/uL (ref 1.7–7.7)
Neutrophils Relative %: 59 %
Platelets: 259 10*3/uL (ref 150–400)
RBC: 3.93 MIL/uL (ref 3.87–5.11)
RDW: 11.2 % — ABNORMAL LOW (ref 11.5–15.5)
WBC: 6.5 10*3/uL (ref 4.0–10.5)
nRBC: 0 % (ref 0.0–0.2)

## 2021-12-27 NOTE — ED Notes (Signed)
Pt called once again for vital signs x3 with no answer. Dragging OTF ?

## 2021-12-27 NOTE — ED Notes (Signed)
Pt called for vital signs w/ no response x3 ?

## 2021-12-27 NOTE — ED Provider Triage Note (Signed)
Emergency Medicine Provider Triage Evaluation Note ? ?[name] , a 56 y.o. female  was evaluated in triage.  Pt complains of full body tingling, chest pain, headaches, and abdominal discomfort.  Reports that this has been going on since she started phentermine 3 days ago.  Follows with weight loss clinic in [place] who prescribes her this medication. ? ?Review of Systems  ?Positive: As above ?Negative: Shortness of breath ? ?Physical Exam  ?BP (!) 145/94 (BP Location: Right Arm)   Pulse (!) 107   Temp 98.1 ?F (36.7 ?C) (Oral)   Resp (!) 25   SpO2 100%  ?Gen:   Awake, no distress   ?Resp:  Normal effort  ?MSK:   Moves extremities without difficulty  ?Other:  Anxious, dilated pupils, full body tremors ? ?Medical Decision Making  ?Medically screening exam initiated at 3:42 PM.  Appropriate orders placed.  [name] was informed that the remainder of the evaluation will be completed by another provider, this initial triage assessment does not replace that evaluation, and the importance of remaining in the ED until their evaluation is complete. ? ?Patient appears to be having a panic attack in triage ?  ?Whitney Benders, PA-C ?12/27/21 1544 ? ?

## 2021-12-27 NOTE — ED Triage Notes (Signed)
Pt states she is on day 4 of Phentermine.  Took it at 9:40am today.  States she had a mild headache and blurred vision yesterday.  Reports shaking, tingling all over, chest tightness, abd pain, blurred vision and headache since 1:30pm today.  Pt anxious. ?

## 2022-01-21 DIAGNOSIS — D225 Melanocytic nevi of trunk: Secondary | ICD-10-CM | POA: Diagnosis not present

## 2022-01-21 DIAGNOSIS — L814 Other melanin hyperpigmentation: Secondary | ICD-10-CM | POA: Diagnosis not present

## 2022-01-30 DIAGNOSIS — F418 Other specified anxiety disorders: Secondary | ICD-10-CM | POA: Diagnosis not present

## 2022-01-30 DIAGNOSIS — M545 Low back pain, unspecified: Secondary | ICD-10-CM | POA: Diagnosis not present

## 2022-01-30 DIAGNOSIS — F17200 Nicotine dependence, unspecified, uncomplicated: Secondary | ICD-10-CM | POA: Diagnosis not present

## 2022-01-30 DIAGNOSIS — I10 Essential (primary) hypertension: Secondary | ICD-10-CM | POA: Diagnosis not present

## 2022-03-11 DIAGNOSIS — L561 Drug photoallergic response: Secondary | ICD-10-CM | POA: Diagnosis not present

## 2022-03-11 DIAGNOSIS — L280 Lichen simplex chronicus: Secondary | ICD-10-CM | POA: Diagnosis not present

## 2022-04-20 DIAGNOSIS — I10 Essential (primary) hypertension: Secondary | ICD-10-CM | POA: Diagnosis not present

## 2022-08-10 DIAGNOSIS — Z0000 Encounter for general adult medical examination without abnormal findings: Secondary | ICD-10-CM | POA: Diagnosis not present

## 2022-08-10 DIAGNOSIS — Z23 Encounter for immunization: Secondary | ICD-10-CM | POA: Diagnosis not present

## 2022-08-11 ENCOUNTER — Other Ambulatory Visit: Payer: Self-pay | Admitting: Physician Assistant

## 2022-08-11 DIAGNOSIS — Z1231 Encounter for screening mammogram for malignant neoplasm of breast: Secondary | ICD-10-CM

## 2022-10-09 ENCOUNTER — Ambulatory Visit
Admission: RE | Admit: 2022-10-09 | Discharge: 2022-10-09 | Disposition: A | Discharge status: Home / Self Care | Payer: BC Managed Care – PPO | Source: Ambulatory Visit | Attending: Physician Assistant | Admitting: Physician Assistant

## 2022-10-09 DIAGNOSIS — Z1231 Encounter for screening mammogram for malignant neoplasm of breast: Secondary | ICD-10-CM | POA: Diagnosis not present

## 2023-02-11 DIAGNOSIS — H5213 Myopia, bilateral: Secondary | ICD-10-CM | POA: Diagnosis not present

## 2023-02-11 DIAGNOSIS — H31002 Unspecified chorioretinal scars, left eye: Secondary | ICD-10-CM | POA: Diagnosis not present

## 2023-05-19 DIAGNOSIS — D225 Melanocytic nevi of trunk: Secondary | ICD-10-CM | POA: Diagnosis not present

## 2023-05-19 DIAGNOSIS — L814 Other melanin hyperpigmentation: Secondary | ICD-10-CM | POA: Diagnosis not present

## 2023-05-19 DIAGNOSIS — L570 Actinic keratosis: Secondary | ICD-10-CM | POA: Diagnosis not present

## 2023-05-19 DIAGNOSIS — D2272 Melanocytic nevi of left lower limb, including hip: Secondary | ICD-10-CM | POA: Diagnosis not present

## 2023-05-19 DIAGNOSIS — D2261 Melanocytic nevi of right upper limb, including shoulder: Secondary | ICD-10-CM | POA: Diagnosis not present

## 2023-08-17 DIAGNOSIS — M545 Low back pain, unspecified: Secondary | ICD-10-CM | POA: Diagnosis not present

## 2023-08-17 DIAGNOSIS — Z23 Encounter for immunization: Secondary | ICD-10-CM | POA: Diagnosis not present

## 2023-08-17 DIAGNOSIS — F17200 Nicotine dependence, unspecified, uncomplicated: Secondary | ICD-10-CM | POA: Diagnosis not present

## 2023-08-17 DIAGNOSIS — F418 Other specified anxiety disorders: Secondary | ICD-10-CM | POA: Diagnosis not present

## 2023-08-17 DIAGNOSIS — Z0000 Encounter for general adult medical examination without abnormal findings: Secondary | ICD-10-CM | POA: Diagnosis not present

## 2023-08-17 DIAGNOSIS — I10 Essential (primary) hypertension: Secondary | ICD-10-CM | POA: Diagnosis not present

## 2023-08-18 ENCOUNTER — Other Ambulatory Visit: Payer: Self-pay | Admitting: Physician Assistant

## 2023-08-18 DIAGNOSIS — Z0000 Encounter for general adult medical examination without abnormal findings: Secondary | ICD-10-CM

## 2023-10-11 ENCOUNTER — Ambulatory Visit
Admission: RE | Admit: 2023-10-11 | Discharge: 2023-10-11 | Disposition: A | Discharge status: Home / Self Care | Payer: BC Managed Care – PPO | Source: Ambulatory Visit | Attending: Physician Assistant | Admitting: Physician Assistant

## 2023-10-11 DIAGNOSIS — Z0000 Encounter for general adult medical examination without abnormal findings: Secondary | ICD-10-CM

## 2023-10-11 DIAGNOSIS — Z1231 Encounter for screening mammogram for malignant neoplasm of breast: Secondary | ICD-10-CM | POA: Diagnosis not present

## 2024-02-14 DIAGNOSIS — H5213 Myopia, bilateral: Secondary | ICD-10-CM | POA: Diagnosis not present

## 2024-02-16 DIAGNOSIS — F418 Other specified anxiety disorders: Secondary | ICD-10-CM | POA: Diagnosis not present

## 2024-02-16 DIAGNOSIS — M545 Low back pain, unspecified: Secondary | ICD-10-CM | POA: Diagnosis not present

## 2024-02-16 DIAGNOSIS — F17210 Nicotine dependence, cigarettes, uncomplicated: Secondary | ICD-10-CM | POA: Diagnosis not present

## 2024-02-16 DIAGNOSIS — I10 Essential (primary) hypertension: Secondary | ICD-10-CM | POA: Diagnosis not present

## 2024-08-17 ENCOUNTER — Other Ambulatory Visit (HOSPITAL_BASED_OUTPATIENT_CLINIC_OR_DEPARTMENT_OTHER): Payer: Self-pay | Admitting: Physician Assistant

## 2024-08-17 ENCOUNTER — Other Ambulatory Visit: Payer: Self-pay | Admitting: Physician Assistant

## 2024-08-17 DIAGNOSIS — F418 Other specified anxiety disorders: Secondary | ICD-10-CM | POA: Diagnosis not present

## 2024-08-17 DIAGNOSIS — Z124 Encounter for screening for malignant neoplasm of cervix: Secondary | ICD-10-CM | POA: Diagnosis not present

## 2024-08-17 DIAGNOSIS — F17210 Nicotine dependence, cigarettes, uncomplicated: Secondary | ICD-10-CM | POA: Diagnosis not present

## 2024-08-17 DIAGNOSIS — I10 Essential (primary) hypertension: Secondary | ICD-10-CM | POA: Diagnosis not present

## 2024-08-17 DIAGNOSIS — Z136 Encounter for screening for cardiovascular disorders: Secondary | ICD-10-CM

## 2024-08-17 DIAGNOSIS — Z1231 Encounter for screening mammogram for malignant neoplasm of breast: Secondary | ICD-10-CM

## 2024-08-17 DIAGNOSIS — Z0000 Encounter for general adult medical examination without abnormal findings: Secondary | ICD-10-CM | POA: Diagnosis not present

## 2024-08-31 ENCOUNTER — Inpatient Hospital Stay (HOSPITAL_BASED_OUTPATIENT_CLINIC_OR_DEPARTMENT_OTHER)
Admission: RE | Admit: 2024-08-31 | Discharge: 2024-08-31 | Payer: Self-pay | Attending: Physician Assistant | Admitting: Physician Assistant

## 2024-08-31 DIAGNOSIS — Z136 Encounter for screening for cardiovascular disorders: Secondary | ICD-10-CM | POA: Insufficient documentation

## 2024-10-09 ENCOUNTER — Other Ambulatory Visit: Payer: Self-pay | Admitting: Medical Genetics

## 2024-10-12 ENCOUNTER — Ambulatory Visit
Admission: RE | Admit: 2024-10-12 | Discharge: 2024-10-12 | Disposition: A | Discharge status: Home / Self Care | Source: Ambulatory Visit | Attending: Physician Assistant | Admitting: Physician Assistant

## 2024-10-12 DIAGNOSIS — Z1231 Encounter for screening mammogram for malignant neoplasm of breast: Secondary | ICD-10-CM

## 2024-11-15 ENCOUNTER — Other Ambulatory Visit (HOSPITAL_COMMUNITY)
